# Patient Record
Sex: Female | Born: 1985 | Race: Asian | Hispanic: No | Marital: Single | Smoking: Never smoker
Health system: Southern US, Community
[De-identification: ages and names within clinical notes are randomized; demographics above are authoritative.]

## PROBLEM LIST (undated history)

## (undated) ENCOUNTER — Inpatient Hospital Stay (HOSPITAL_COMMUNITY): Payer: Self-pay

## (undated) DIAGNOSIS — Z789 Other specified health status: Secondary | ICD-10-CM

## (undated) HISTORY — PX: NO PAST SURGERIES: SHX2092

---

## 2008-06-25 ENCOUNTER — Other Ambulatory Visit: Admission: RE | Admit: 2008-06-25 | Discharge: 2008-06-25 | Payer: Self-pay | Admitting: Family Medicine

## 2011-09-02 ENCOUNTER — Ambulatory Visit: Payer: Self-pay | Admitting: Internal Medicine

## 2011-09-02 VITALS — BP 102/64 | HR 69 | Temp 98.7°F | Resp 16 | Ht 58.58 in | Wt 96.2 lb

## 2011-09-02 DIAGNOSIS — J00 Acute nasopharyngitis [common cold]: Secondary | ICD-10-CM

## 2011-09-02 DIAGNOSIS — R5383 Other fatigue: Secondary | ICD-10-CM

## 2011-09-02 DIAGNOSIS — B078 Other viral warts: Secondary | ICD-10-CM

## 2011-09-02 DIAGNOSIS — B079 Viral wart, unspecified: Secondary | ICD-10-CM

## 2011-09-02 LAB — POCT URINE PREGNANCY: Preg Test, Ur: NEGATIVE

## 2011-09-02 MED ORDER — IPRATROPIUM BROMIDE 0.06 % NA SOLN: 2.0000 | Freq: Four times a day (QID) | NASAL | Status: AC

## 2011-09-02 NOTE — Progress Notes (Signed)
  Subjective:    Patient ID: Krystal Bruce, female    DOB: [birth year], 26 y.o.   MRN: [id number]  HPI  Krystal Bruce is a healthy 26 year old [place] female here with two complaints.  The first is she has a wart on the palm of her right hand which started a month ago (she got a splinter and removed it).  She has tried to cut it out but it keeps coming back.  She has had a wart on her right hand in a different place but it went away spontaneously.  Her other complaint is that she began to have rhinitis yesterday and some fatigue.  Generally she is very healthy, with no chronic disease or problem     Review of Systems  Constitutional: Negative.   HENT: Positive for rhinorrhea, sneezing and postnasal drip.   Eyes: Negative.   Respiratory: Negative.   Cardiovascular: Negative.   Gastrointestinal: Negative.   Genitourinary: Negative.   Musculoskeletal: Negative.   Neurological: Negative.   Hematological: Negative.   Psychiatric/Behavioral: Negative.   All other systems reviewed and are negative.       Objective:   Physical Exam  Vitals reviewed. Constitutional: She is oriented to person, place, and time. She appears well-developed and well-nourished.  HENT:  Head: Normocephalic.  Right Ear: External ear normal.  Left Ear: External ear normal.  Cardiovascular: Normal rate, regular rhythm and normal heart sounds.   Neurological: She is alert and oriented to person, place, and time.  Skin:       0.5 cm brown cauliflower lesion on right palm  Psychiatric: She has a normal mood and affect. Her behavior is normal.          Assessment & Plan:  1.  Rhinitis:  Atrovent NS 0.6% 2 squirts in each nostril BID.   2.  Verruca right palm  Cryotherapy with a freeze/thaw, freeze/ thaw, was done.  Duo-film OTC if needed or return for additional freezing.  Given the name of Dr. Terri Piedra if she wishes to see a dermatologist.  RTC prn

## 2013-01-19 ENCOUNTER — Ambulatory Visit (INDEPENDENT_AMBULATORY_CARE_PROVIDER_SITE_OTHER): Payer: BC Managed Care – PPO | Admitting: Physician Assistant

## 2013-01-19 VITALS — BP 96/68 | HR 83 | Temp 98.4°F | Resp 17 | Ht 58.5 in | Wt 98.0 lb

## 2013-01-19 DIAGNOSIS — N760 Acute vaginitis: Secondary | ICD-10-CM

## 2013-01-19 DIAGNOSIS — Z0000 Encounter for general adult medical examination without abnormal findings: Secondary | ICD-10-CM

## 2013-01-19 DIAGNOSIS — Z124 Encounter for screening for malignant neoplasm of cervix: Secondary | ICD-10-CM

## 2013-01-19 DIAGNOSIS — Z01419 Encounter for gynecological examination (general) (routine) without abnormal findings: Secondary | ICD-10-CM

## 2013-01-19 DIAGNOSIS — B9689 Other specified bacterial agents as the cause of diseases classified elsewhere: Secondary | ICD-10-CM

## 2013-01-19 DIAGNOSIS — N898 Other specified noninflammatory disorders of vagina: Secondary | ICD-10-CM

## 2013-01-19 LAB — POCT CBC
HCT, POC: 42.5 % (ref 37.7–47.9)
Hemoglobin: 14 g/dL (ref 12.2–16.2)
Lymph, poc: 2.3 (ref 0.6–3.4)
MCHC: 32.9 g/dL (ref 31.8–35.4)
POC Granulocyte: 3.9 (ref 2–6.9)
WBC: 6.6 10*3/uL (ref 4.6–10.2)

## 2013-01-19 LAB — POCT WET PREP WITH KOH
KOH Prep POC: POSITIVE
Yeast Wet Prep HPF POC: NEGATIVE

## 2013-01-19 LAB — POCT URINALYSIS DIPSTICK
Bilirubin, UA: NEGATIVE
Blood, UA: NEGATIVE
Glucose, UA: NEGATIVE
Leukocytes, UA: NEGATIVE
Nitrite, UA: NEGATIVE

## 2013-01-19 MED ORDER — METRONIDAZOLE 500 MG PO TABS: 500.0000 mg | ORAL_TABLET | Freq: Two times a day (BID) | ORAL | Status: DC

## 2013-01-19 NOTE — Progress Notes (Signed)
Subjective:    Patient ID: Krystal Bruce, female    DOB: 1986/05/05, 27 y.o.   MRN: [id number]  HPI   Krystal Bruce is a very pleasant 27 yr old female here for CPE.  Thinks last CPE was here two years ago.  Complaints: would like pregnancy test; trying to conceive; otherwise feels well with no concerns LMP:  12/22/12, regular periods every month Contraception: none (trying to conceive) Pap/pelvic/breast/mammo:  Thinks last pap 2 yrs ago (last in epic 4 yrs ago, normal); SBEs - no concerns; does request STI testing today Dentist:  Not regularly - hasn't had time to go Eye doctor:  Has rx'd lenses but does not always wear, last eye doctor 1 yr ago Imm: thinks utd - recent college grad, knows she was utd while in school Diet: no special diet, 3 meals per day; doesn't drink much water Exercise: none Meds: none Family history: Mom, dad, brother healthy Does not know any further family history  No smoking or etoh use, no other substance use  Self-employed; lives with parents  Engaged  Review of Systems  Constitutional: Negative.   HENT: Negative.   Respiratory: Negative.   Cardiovascular: Negative.   Gastrointestinal: Negative.   Genitourinary: Positive for vaginal discharge.  Musculoskeletal: Negative.   Skin: Negative.   Neurological: Negative.        Objective:   Physical Exam  Vitals reviewed. Constitutional: She is oriented to person, place, and time. She appears well-developed and well-nourished. No distress.  HENT:  Head: Normocephalic and atraumatic.  Right Ear: Tympanic membrane and ear canal normal.  Left Ear: Tympanic membrane and ear canal normal.  Mouth/Throat: Uvula is midline, oropharynx is clear and moist and mucous membranes are normal.  Eyes: Conjunctivae and EOM are normal. No scleral icterus.  Neck: Normal range of motion. Neck supple. No thyromegaly present.  Cardiovascular: Normal rate, regular rhythm, normal heart sounds and intact distal pulses.  Exam reveals  no gallop and no friction rub.   No murmur heard. Pulmonary/Chest: Effort normal and breath sounds normal. She has no wheezes. She has no rales. Right breast exhibits no mass, no nipple discharge, no skin change and no tenderness. Left breast exhibits no mass, no nipple discharge, no skin change and no tenderness. Breasts are symmetrical.  Abdominal: Soft. Bowel sounds are normal. There is no tenderness.  Genitourinary: Uterus normal. There is no rash, tenderness or lesion on the right labia. There is no rash, tenderness or lesion on the left labia. Cervix exhibits friability. Cervix exhibits no motion tenderness and no discharge. Right adnexum displays tenderness. Right adnexum displays no mass and no fullness. Left adnexum displays no mass, no tenderness and no fullness. Vaginal discharge (copious, homogenous white ) found.  Musculoskeletal: Normal range of motion.  Lymphadenopathy:    She has no cervical adenopathy.  Neurological: She is alert and oriented to person, place, and time. She has normal reflexes.  Skin: Skin is warm and dry.  Psychiatric: She has a normal mood and affect. Her behavior is normal.     Results for orders placed in visit on 01/19/13  POCT CBC      Result Value Range   WBC 6.6  4.6 - 10.2 K/uL   Lymph, poc 2.3  0.6 - 3.4   POC LYMPH PERCENT 35.5  10 - 50 %L   MID (cbc) 0.4  0 - 0.9   POC MID % 6.0  0 - 12 %M   POC Granulocyte 3.9  2 -  6.9   Granulocyte percent 58.5  37 - 80 %G   RBC 4.82  4.04 - 5.48 M/uL   Hemoglobin 14.0  12.2 - 16.2 g/dL   HCT, POC 16.1  09.6 - 47.9 %   MCV 88.1  80 - 97 fL   MCH, POC 29.0  27 - 31.2 pg   MCHC 32.9  31.8 - 35.4 g/dL   RDW, POC 04.5     Platelet Count, POC 229  142 - 424 K/uL   MPV 9.1  0 - 99.8 fL  POCT URINALYSIS DIPSTICK      Result Value Range   Color, UA yellow     Clarity, UA clear     Glucose, UA neg     Bilirubin, UA neg     Ketones, UA trace     Spec Grav, UA 1.020     Blood, UA neg     pH, UA 7.0      Protein, UA neg     Urobilinogen, UA 0.2     Nitrite, UA neg     Leukocytes, UA Negative    POCT URINE PREGNANCY      Result Value Range   Preg Test, Ur Negative    POCT WET PREP WITH KOH      Result Value Range   Trichomonas, UA Negative     Clue Cells Wet Prep HPF POC 100%     Epithelial Wet Prep HPF POC 5-15     Yeast Wet Prep HPF POC neg     Bacteria Wet Prep HPF POC 3+     RBC Wet Prep HPF POC neg     WBC Wet Prep HPF POC tntc     KOH Prep POC Positive     Sperm positive         Assessment & Plan:  Routine general medical examination at a health care facility - Plan: POCT CBC, TSH, POCT urinalysis dipstick, POCT urine pregnancy, Comprehensive metabolic panel, HIV antibody, Lipid panel, RPR, Pap IG, CT/NG w/ reflex HPV when ASC-U  Encounter for routine gynecological examination - Plan: POCT urine pregnancy, Pap IG, CT/NG w/ reflex HPV when ASC-U, CANCELED: Pap IG, CT/NG w/ reflex HPV when ASC-U  Screening for cervical cancer - Plan: Pap IG, CT/NG w/ reflex HPV when ASC-U, CANCELED: Pap IG, CT/NG w/ reflex HPV when ASC-U  Vaginal discharge - Plan: POCT Wet Prep with KOH  Bacterial vaginosis - Plan: metroNIDAZOLE (FLAGYL) 500 MG tablet    Krystal Bruce is a very pleasant 27 yr old female here for CPE.  She appears to be in good health and exam is normal.  She is currently trying to conceive.  HCG is negative today.  Wet prep does show BV so will treat with metronidazole.  Pap pending.  CT/NG, HIV, RPR all pending.  CMP, lipid, TSH pending as well.  Will follow up on labs.

## 2013-01-19 NOTE — Patient Instructions (Addendum)
Begin taking the metronidazole as directed.  Be sure to finish the full course.  Keckler NOT DRINK ALCOHOL WHILE TAKING THIS MEDICATION or for 48 hours after your last dose.  Please let us know if any symptoms worsen or Boylan not improve.  I will let you know when the rest of your labs are back.   Bacterial Vaginosis Bacterial vaginosis (BV) is a vaginal infection where the normal balance of bacteria in the vagina is disrupted. The normal balance is then replaced by an overgrowth of certain bacteria. There are several different kinds of bacteria that can cause BV. BV is the most common vaginal infection in women of childbearing age. CAUSES   The cause of BV is not fully understood. BV develops when there is an increase or imbalance of harmful bacteria.  Some activities or behaviors can upset the normal balance of bacteria in the vagina and put women at increased risk including:  Having a new sex partner or multiple sex partners.  Douching.  Using an intrauterine device (IUD) for contraception.  It is not clear what role sexual activity plays in the development of BV. However, women that have never had sexual intercourse are rarely infected with BV. Women Cabriales not get BV from toilet seats, bedding, swimming pools or from touching objects around them.  SYMPTOMS   Grey vaginal discharge.  A fish-like odor with discharge, especially after sexual intercourse.  Itching or burning of the vagina and vulva.  Burning or pain with urination.  Some women have no signs or symptoms at all. DIAGNOSIS  Your caregiver must examine the vagina for signs of BV. Your caregiver will perform lab tests and look at the sample of vaginal fluid through a microscope. They will look for bacteria and abnormal cells (clue cells), a pH test higher than 4.5, and a positive amine test all associated with BV.  RISKS AND COMPLICATIONS   Pelvic inflammatory disease (PID).  Infections following gynecology surgery.  Developing  HIV.  Developing herpes virus. TREATMENT  Sometimes BV will clear up without treatment. However, all women with symptoms of BV should be treated to avoid complications, especially if gynecology surgery is planned. Female partners generally Esper not need to be treated. However, BV may spread between female sex partners so treatment is helpful in preventing a recurrence of BV.   BV may be treated with antibiotics. The antibiotics come in either pill or vaginal cream forms. Either can be used with nonpregnant or pregnant women, but the recommended dosages differ. These antibiotics are not harmful to the baby.  BV can recur after treatment. If this happens, a second round of antibiotics will often be prescribed.  Treatment is important for pregnant women. If not treated, BV can cause a premature delivery, especially for a pregnant woman who had a premature birth in the past. All pregnant women who have symptoms of BV should be checked and treated.  For chronic reoccurrence of BV, treatment with a type of prescribed gel vaginally twice a week is helpful. HOME CARE INSTRUCTIONS   Finish all medication as directed by your caregiver.  Burnstein not have sex until treatment is completed.  Tell your sexual partner that you have a vaginal infection. They should see their caregiver and be treated if they have problems, such as a mild rash or itching.  Practice safe sex. Use condoms. Only have 1 sex partner. PREVENTION  Basic prevention steps can help reduce the risk of upsetting the natural balance of bacteria in the  vagina and developing BV:  Terwilliger not have sexual intercourse (be abstinent).  Dawson not douche.  Use all of the medicine prescribed for treatment of BV, even if the signs and symptoms go away.  Tell your sex partner if you have BV. That way, they can be treated, if needed, to prevent reoccurrence. SEEK MEDICAL CARE IF:   Your symptoms are not improving after 3 days of treatment.  You have  increased discharge, pain, or fever. MAKE SURE YOU:   Understand these instructions.  Will watch your condition.  Will get help right away if you are not doing well or get worse. FOR MORE INFORMATION  Division of STD Prevention (DSTDP), Centers for Disease Control and Prevention: SolutionApps.co.za American Social Health Association (ASHA): www.ashastd.org  Document Released: 06/08/2005 Document Revised: 08/31/2011 Document Reviewed: 11/29/2008 [facility] Patient Information 2014 [place], [place].   Health Maintenance, Females A healthy lifestyle and preventative care can promote health and wellness.  Maintain regular health, dental, and eye exams.  Eat a healthy diet. Foods like vegetables, fruits, whole grains, low-fat dairy products, and lean protein foods contain the nutrients you need without too many calories. Decrease your intake of foods high in solid fats, added sugars, and salt. Get information about a proper diet from your caregiver, if necessary.  Regular physical exercise is one of the most important things you can [place] for your health. Most adults should get at least 150 minutes of moderate-intensity exercise (any activity that increases your heart rate and causes you to sweat) each week. In addition, most adults need muscle-strengthening exercises on 2 or more days a week.   Maintain a healthy weight. The body mass index (BMI) is a screening tool to identify possible weight problems. It provides an estimate of body fat based on height and weight. Your caregiver can help determine your BMI, and can help you achieve or maintain a healthy weight. For adults 20 years and older:  A BMI below 18.5 is considered underweight.  A BMI of 18.5 to 24.9 is normal.  A BMI of 25 to 29.9 is considered overweight.  A BMI of 30 and above is considered obese.  Maintain normal blood lipids and cholesterol by exercising and minimizing your intake of saturated fat. Eat a balanced diet with plenty  of fruits and vegetables. Blood tests for lipids and cholesterol should begin at age 63 and be repeated every 5 years. If your lipid or cholesterol levels are high, you are over 50, or you are a high risk for heart disease, you may need your cholesterol levels checked more frequently.Ongoing high lipid and cholesterol levels should be treated with medicines if diet and exercise are not effective.  If you smoke, find out from your caregiver how to quit. If you Urbas not use tobacco, [place] not start.  If you are pregnant, Ligman not drink alcohol. If you are breastfeeding, be very cautious about drinking alcohol. If you are not pregnant and choose to drink alcohol, Kassa not exceed 1 drink per day. One drink is considered to be 12 ounces (355 mL) of beer, 5 ounces (148 mL) of wine, or 1.5 ounces (44 mL) of liquor.  Avoid use of street drugs. Cowley not share needles with anyone. Ask for help if you need support or instructions about stopping the use of drugs.  High blood pressure causes heart disease and increases the risk of stroke. Blood pressure should be checked at least every 1 to 2 years. Ongoing high blood pressure should be  treated with medicines, if weight loss and exercise are not effective.  If you are 43 to 27 years old, ask your caregiver if you should take aspirin to prevent strokes.  Diabetes screening involves taking a blood sample to check your fasting blood sugar level. This should be done once every 3 years, after age 77, if you are within normal weight and without risk factors for diabetes. Testing should be considered at a younger age or be carried out more frequently if you are overweight and have at least 1 risk factor for diabetes.  Breast cancer screening is essential preventative care for women. You should practice "breast self-awareness." This means understanding the normal appearance and feel of your breasts and may include breast self-examination. Any changes detected, no matter how small,  should be reported to a caregiver. Women in their 44s and 30s should have a clinical breast exam (CBE) by a caregiver as part of a regular health exam every 1 to 3 years. After age 23, women should have a CBE every year. Starting at age 84, women should consider having a mammogram (breast X-ray) every year. Women who have a family history of breast cancer should talk to their caregiver about genetic screening. Women at a high risk of breast cancer should talk to their caregiver about having an MRI and a mammogram every year.  The Pap test is a screening test for cervical cancer. Women should have a Pap test starting at age 49. Between ages 68 and 48, Pap tests should be repeated every 2 years. Beginning at age 65, you should have a Pap test every 3 years as long as the past 3 Pap tests have been normal. If you had a hysterectomy for a problem that was not cancer or a condition that could lead to cancer, then you no longer need Pap tests. If you are between ages 60 and 57, and you have had normal Pap tests going back 10 years, you no longer need Pap tests. If you have had past treatment for cervical cancer or a condition that could lead to cancer, you need Pap tests and screening for cancer for at least 20 years after your treatment. If Pap tests have been discontinued, risk factors (such as a new sexual partner) need to be reassessed to determine if screening should be resumed. Some women have medical problems that increase the chance of getting cervical cancer. In these cases, your caregiver may recommend more frequent screening and Pap tests.  The human papillomavirus (HPV) test is an additional test that may be used for cervical cancer screening. The HPV test looks for the virus that can cause the cell changes on the cervix. The cells collected during the Pap test can be tested for HPV. The HPV test could be used to screen women aged 59 years and older, and should be used in women of any age who have unclear  Pap test results. After the age of 73, women should have HPV testing at the same frequency as a Pap test.  Colorectal cancer can be detected and often prevented. Most routine colorectal cancer screening begins at the age of 57 and continues through age 52. However, your caregiver may recommend screening at an earlier age if you have risk factors for colon cancer. On a yearly basis, your caregiver may provide home test kits to check for hidden blood in the stool. Use of a small camera at the end of a tube, to directly examine the colon (sigmoidoscopy or  colonoscopy), can detect the earliest forms of colorectal cancer. Talk to your caregiver about this at age 23, when routine screening begins. Direct examination of the colon should be repeated every 5 to 10 years through age 29, unless early forms of pre-cancerous polyps or small growths are found.  Hepatitis C blood testing is recommended for all people born from [birth year] through 1965 and any individual with known risks for hepatitis C.  Practice safe sex. Use condoms and avoid high-risk sexual practices to reduce the spread of sexually transmitted infections (STIs). Sexually active women aged 31 and younger should be checked for Chlamydia, which is a common sexually transmitted infection. Older women with new or multiple partners should also be tested for Chlamydia. Testing for other STIs is recommended if you are sexually active and at increased risk.  Osteoporosis is a disease in which the bones lose minerals and strength with aging. This can result in serious bone fractures. The risk of osteoporosis can be identified using a bone density scan. Women ages 52 and over and women at risk for fractures or osteoporosis should discuss screening with their caregivers. Ask your caregiver whether you should be taking a calcium supplement or vitamin D to reduce the rate of osteoporosis.  Menopause can be associated with physical symptoms and risks. Hormone replacement  therapy is available to decrease symptoms and risks. You should talk to your caregiver about whether hormone replacement therapy is right for you.  Use sunscreen with a sun protection factor (SPF) of 30 or greater. Apply sunscreen liberally and repeatedly throughout the day. You should seek shade when your shadow is shorter than you. Protect yourself by wearing long sleeves, pants, a wide-brimmed hat, and sunglasses year round, whenever you are outdoors.  Notify your caregiver of new moles or changes in moles, especially if there is a change in shape or color. Also notify your caregiver if a mole is larger than the size of a pencil eraser.  Stay current with your immunizations. Document Released: 12/22/2010 Document Revised: 08/31/2011 Document Reviewed: 12/22/2010 [facility] Patient Information 2014 [place], [place].

## 2013-01-20 LAB — COMPREHENSIVE METABOLIC PANEL
ALT: <8 U/L (ref 0–35)
AST: 13 U/L (ref 0–37)
Albumin: 4.6 g/dL (ref 3.5–5.2)
Alkaline Phosphatase: 34 U/L — ABNORMAL LOW (ref 39–117)
Glucose, Bld: 81 mg/dL (ref 70–99)
Potassium: 4.4 mEq/L (ref 3.5–5.3)
Sodium: 138 mEq/L (ref 135–145)
Total Protein: 7.6 g/dL (ref 6.0–8.3)

## 2013-01-20 LAB — TSH: TSH: 1.356 u[IU]/mL (ref 0.350–4.500)

## 2013-01-20 LAB — PAP IG, CT-NG, RFX HPV ASCU: GC Probe Amp: NEGATIVE

## 2013-01-20 LAB — RPR

## 2013-01-20 LAB — LIPID PANEL
LDL Cholesterol: 99 mg/dL (ref 0–99)
VLDL: 9 mg/dL (ref 0–40)

## 2013-02-25 ENCOUNTER — Encounter: Payer: Self-pay | Admitting: Physician Assistant

## 2013-02-25 ENCOUNTER — Ambulatory Visit (INDEPENDENT_AMBULATORY_CARE_PROVIDER_SITE_OTHER): Payer: BC Managed Care – PPO | Admitting: Physician Assistant

## 2013-02-25 VITALS — BP 100/58 | HR 78 | Temp 97.2°F | Resp 16 | Ht <= 58 in | Wt 97.2 lb

## 2013-02-25 DIAGNOSIS — N926 Irregular menstruation, unspecified: Secondary | ICD-10-CM

## 2013-02-25 DIAGNOSIS — N925 Other specified irregular menstruation: Secondary | ICD-10-CM

## 2013-02-25 DIAGNOSIS — N949 Unspecified condition associated with female genital organs and menstrual cycle: Secondary | ICD-10-CM

## 2013-02-25 NOTE — Patient Instructions (Addendum)
CONGRATULATIONS!!!  You need to establish with an OB/GYN. Please start an over-the-counter prenatal vitamin.   Based on the date of your last menstrual period, you are 4 weeks and 1 day pregnant, and you are due on 11/04/2013.

## 2013-02-26 NOTE — Progress Notes (Signed)
  Subjective:    Patient ID: BEKKA QIAN, female    DOB: April 21, 1986, 27 y.o.   MRN: [id number]  HPI This 27 y.o. female presents for evaluation of late menses.  She is actually not late based on her LMP, but because last month her period was a few days later than usual, she believes she is late this month, too.  She and her husband are actively trying to become pregnant.  She feels well, without nausea, fatigue or breast tenderness.  Review of Systems As above.    Objective:   Physical Exam BP 100/58  Pulse 78  Temp(Src) 97.2 F (36.2 C) (Oral)  Resp 16  Ht 4\' 10"  (1.473 m)  Wt 97 lb 3.2 oz (44.09 kg)  BMI 20.32 kg/m2  SpO2 100%  LMP 01/27/2013 WDWN, but very thin, Asian female who is A&O x 3.  Her husband is present. Normal respiratory effort. Normal mood and affect, normal behavior.  Results for orders placed in visit on 02/25/13  POCT URINE PREGNANCY      Result Value Range   Preg Test, Ur Positive          Assessment & Plan:  Menstrual period late - Plan: POCT urine pregnancy  Schedule with OB of choice.  Begin Prenatal Vitamins QD.  Anticipatory guidance provided.  Fernande Bras, PA-C Physician Assistant-Certified [facility]

## 2013-03-06 ENCOUNTER — Ambulatory Visit (INDEPENDENT_AMBULATORY_CARE_PROVIDER_SITE_OTHER): Payer: BC Managed Care – PPO | Admitting: Physician Assistant

## 2013-03-06 VITALS — BP 82/54 | HR 98 | Temp 99.3°F | Resp 16 | Ht 58.5 in | Wt 97.0 lb

## 2013-03-06 DIAGNOSIS — R42 Dizziness and giddiness: Secondary | ICD-10-CM

## 2013-03-06 DIAGNOSIS — Z331 Pregnant state, incidental: Secondary | ICD-10-CM

## 2013-03-06 DIAGNOSIS — Z3490 Encounter for supervision of normal pregnancy, unspecified, unspecified trimester: Secondary | ICD-10-CM

## 2013-03-06 NOTE — Patient Instructions (Signed)
I like the book Your Pregnancy Week by Week by Lindwood Qua  Drink 64 ounces of water a day. Make healthy eating choices. Get exercise every day-even just walking for 15-30 minutes can help you feel better. Consider wearing a light support athletic bra at night if your breasts are tender.

## 2013-03-06 NOTE — Progress Notes (Signed)
  Subjective:    Patient ID: Krystal Bruce, female    DOB: 1985-10-20, 27 y.o.   MRN: [id number]  HPI  This 27 y.o. female presents for evaluation of concerns about her pregnancy.    LMP 01/27/2013.  She presented here 02/25/2013 requesting a pregnancy test, which was positive. She and her husband had just returned from [place], a pre-wedding trip, were actively trying to become pregnant. They were given information about pregnancy and scheduling with OB/GYN, and was advised to start a prenatal vitamin. She reports that she has been taking the prenatal vitamin, but lost the information I gave her and is feeling nervous.  "I don't even know how to find a doctor!" "I just want a check to make sure the baby is OK."  Their wedding was yesterday.  She was under a lot of stress, as she planned the trip, wedding and reception for 400+ people by herself.   Since finding out she is pregnant, she has developed some breast tenderness, especially at night.  Daytime sleepiness, nighttime hunger. No nausea. Mild brief pelvic discomfort, no vaginal bleeding.  Yesterday was very emotional, and she found herself more irritable than usual, getting angry when she normally wouldn't. She has felt some heaviness '"inside," (points to the upper abdomen). This morning she was a little dizzy.  Review of Systems As above.    Objective:   Physical Exam BP 82/54  Pulse 98  Temp(Src) 99.3 F (37.4 C) (Oral)  Resp 16  Ht 4' 10.5" (1.486 m)  Wt 97 lb (43.999 kg)  BMI 19.93 kg/m2  SpO2 100%  LMP 01/27/2013 WDWNAF, A&O x 3. A little anxious, but otherwise normal appearing and behavior.  5 3/[redacted] weeks gestation; LMP 01/27/2013, EDD 11/04/2013     Assessment & Plan:  Pregnancy - Plan: Ambulatory referral to Obstetrics / Gynecology; 5 3/[redacted] weeks gestation; LMP 01/27/2013, EDD 11/04/2013  Referral made to help reduce her anxiety, and advised her that they may not schedule her until closer to 10-12 weeks. Reassured that her  feelings are normal.  Discussed that I cannot hear the heartbeat this early, and that an OB US can document intrauterine pregnancy, but not necessarily that "everything is ok." Anticipatory guidance provided.    Continue prenatal vitamin. 64 ounces of water daily. Healthy eating choices, walking for exercise. Get the book, Your Pregnancy Week by Week  Fernande Bras, PA-C Physician Assistant-Certified [facility]

## 2013-03-10 ENCOUNTER — Inpatient Hospital Stay (HOSPITAL_COMMUNITY)
Admission: AD | Admit: 2013-03-10 | Discharge: 2013-03-10 | Disposition: A | Discharge status: Home / Self Care | Payer: BC Managed Care – PPO | Source: Ambulatory Visit | Attending: Obstetrics & Gynecology | Admitting: Obstetrics & Gynecology

## 2013-03-10 ENCOUNTER — Inpatient Hospital Stay (HOSPITAL_COMMUNITY): Payer: BC Managed Care – PPO

## 2013-03-10 ENCOUNTER — Encounter (HOSPITAL_COMMUNITY): Payer: Self-pay | Admitting: *Deleted

## 2013-03-10 DIAGNOSIS — Z363 Encounter for antenatal screening for malformations: Secondary | ICD-10-CM

## 2013-03-10 DIAGNOSIS — Z3490 Encounter for supervision of normal pregnancy, unspecified, unspecified trimester: Secondary | ICD-10-CM

## 2013-03-10 DIAGNOSIS — M545 Low back pain, unspecified: Secondary | ICD-10-CM | POA: Insufficient documentation

## 2013-03-10 DIAGNOSIS — O99891 Other specified diseases and conditions complicating pregnancy: Secondary | ICD-10-CM | POA: Insufficient documentation

## 2013-03-10 DIAGNOSIS — R109 Unspecified abdominal pain: Secondary | ICD-10-CM | POA: Insufficient documentation

## 2013-03-10 DIAGNOSIS — N949 Unspecified condition associated with female genital organs and menstrual cycle: Secondary | ICD-10-CM | POA: Insufficient documentation

## 2013-03-10 DIAGNOSIS — Z1389 Encounter for screening for other disorder: Secondary | ICD-10-CM

## 2013-03-10 LAB — WET PREP, GENITAL
Clue Cells Wet Prep HPF POC: NONE SEEN
Trich, Wet Prep: NONE SEEN

## 2013-03-10 LAB — CBC
Hemoglobin: 13.2 g/dL (ref 12.0–15.0)
MCH: 28.9 pg (ref 26.0–34.0)
MCV: 82.3 fL (ref 78.0–100.0)
Platelets: 207 10*3/uL (ref 150–400)
RBC: 4.57 MIL/uL (ref 3.87–5.11)

## 2013-03-10 NOTE — MAU Provider Note (Signed)
History     CSN: [id number]  Arrival date and time: 03/10/13 1135   First Provider Initiated Contact with Patient 03/10/13 1421      No chief complaint on file.  HPI This is a 27 y.o. female at [redacted]w[redacted]d who presents with c/o being "stressed out" during wedding and wants to "see if the baby is all right".  Does c/o low back pain and pelvic pressure for "a while".  Denies bleeding or other symptoms.   RN Note: Was in [place] in Aug, had period 08/08-10, this was late. Beginning of Sept Found out was preg. Been very stressed- wants to make sure everything is ok. Feels tired. First appt is 10/07  Revision History...     OB History   Grav Para Term Preterm Abortions TAB SAB Ect Mult Living   1               History reviewed. No pertinent past medical history.  History reviewed. No pertinent past surgical history.  History reviewed. No pertinent family history.  History  Substance Use Topics  . Smoking status: Never Smoker   . Smokeless tobacco: Not on file  . Alcohol Use: No    Allergies: No Known Allergies  Prescriptions prior to admission  Medication Sig Dispense Refill  . prenatal vitamin w/FE, FA (NATACHEW) 29-1 MG CHEW chewable tablet Chew 2 tablets by mouth daily at 12 noon.         Review of Systems  Constitutional: Negative for fever, chills and malaise/fatigue.  Gastrointestinal: Positive for abdominal pain. Negative for nausea, vomiting, diarrhea and constipation.  Genitourinary: Negative for dysuria.  Musculoskeletal: Positive for back pain. Negative for myalgias.  Neurological: Negative for dizziness, weakness and headaches.   Physical Exam   Blood pressure 100/57, pulse 88, temperature 98.3 F (36.8 C), temperature source Oral, resp. rate 16, height 4\' 9"  (1.448 m), weight 44.271 kg (97 lb 9.6 oz), last menstrual period 01/27/2013.  Physical Exam  Constitutional: She is oriented to person, place, and time. She appears well-developed and well-nourished. No  distress.  Cardiovascular: Normal rate.   Respiratory: Effort normal.  GI: Soft. She exhibits no distension and no mass. There is tenderness (slight suprapubic tenderness). There is no rebound and no guarding.  Genitourinary: Vagina normal and uterus normal. No vaginal discharge found.  Uterus slightly tender, 5-6 wk size Adnexae nontender  Musculoskeletal: Normal range of motion.  Neurological: She is alert and oriented to person, place, and time.  Skin: Skin is warm and dry.  Psychiatric: She has a normal mood and affect.    MAU Course  Procedures  MDM Results for orders placed during the hospital encounter of 03/10/13 (from the past 72 hour(s))  WET PREP, GENITAL     Status: Abnormal   Collection Time    03/10/13  2:43 PM      Result Value Range   Yeast Wet Prep HPF POC RARE (*) NONE SEEN   Trich, Wet Prep NONE SEEN  NONE SEEN   Clue Cells Wet Prep HPF POC NONE SEEN  NONE SEEN   WBC, Wet Prep HPF POC FEW (*) NONE SEEN   Comment: BACTERIA- TOO NUMEROUS TO COUNT  HCG, QUANTITATIVE, PREGNANCY     Status: Abnormal   Collection Time    03/10/13  2:55 PM      Result Value Range   hCG, Beta Chain, Quant, S [id number] (*) <5 mIU/mL   Comment:  GEST. AGE      CONC.  (mIU/mL)       <=1 WEEK        5 - 50         2 WEEKS       50 - 500         3 WEEKS       100 - 10,000         4 WEEKS     1,000 - 30,000         5 WEEKS     3,500 - 115,000       6-8 WEEKS     12,000 - 270,000        12 WEEKS     15,000 - 220,000                FEMALE AND NON-PREGNANT FEMALE:         LESS THAN 5 mIU/mL  ABO/RH     Status: None   Collection Time    03/10/13  2:55 PM      Result Value Range   ABO/RH(D) B POS    CBC     Status: None   Collection Time    03/10/13  2:55 PM      Result Value Range   WBC 7.9  4.0 - 10.5 K/uL   RBC 4.57  3.87 - 5.11 MIL/uL   Hemoglobin 13.2  12.0 - 15.0 g/dL   HCT 16.1  09.6 - 04.5 %   MCV 82.3  78.0 - 100.0 fL   MCH 28.9  26.0 - 34.0 pg   MCHC 35.1   30.0 - 36.0 g/dL   RDW 40.9  81.1 - 91.4 %   Platelets 207  150 - 400 K/uL  US Ob Transvaginal  03/10/2013   *RADIOLOGY REPORT*  Clinical Data: Pelvic pain.  Positive pregnancy test.  OBSTETRIC <14 WK [place] AND TRANSVAGINAL OB US  Technique:  Both transabdominal and transvaginal ultrasound examinations were performed for complete evaluation of the gestation as well as the maternal uterus, adnexal regions, and pelvic cul-de-sac.  Transvaginal technique was performed to assess early pregnancy.  Comparison:  None.  Intrauterine gestational sac:  Visualized/normal in shape. Yolk sac: Visualized Embryo: Visualized Cardiac Activity: Visualized Heart Rate: Unable to accurately measured due to small embryo size but visualized at real time exam.  CRL: 2  mm  5 w  6 d          [place] EDC: 11/04/13  Maternal uterus/adnexae: Left ovary not visualized.  Right ovary normal.  Small free fluid noted.    IMPRESSION: Intrauterine gestational sac, yolk sac, fetal pole, and cardiac activity visualized.  No acute abnormality.     Original Report Authenticated By: Christiana Pellant, M.D.    Assessment and Plan  A:  SIUP at 5.6 weeks      Abdominal pain probably GI or ligamentous   P:  Discussed findings.       Has PN vitamins      Encouraged to seek [facility], Had an appt in our clinic, but does not want to wait that long so will look for other practice as she is very anxious about this pregnancy  [facility] 03/10/2013, 3:29 PM

## 2013-03-10 NOTE — MAU Note (Addendum)
Was in [place] in Aug, had period 08/08-10, this was late.  Beginning of Sept  Found out was preg.   Been very stressed- wants to make sure everything is ok.  Feels tired.  First appt is 10/07

## 2013-03-19 NOTE — MAU Provider Note (Signed)
Attestation of Attending Supervision of Advanced Practitioner: Evaluation and management procedures were performed by the PA/NP/CNM/OB Fellow under my supervision/collaboration. Chart reviewed and agree with management and plan.  Tilda Burrow 03/19/2013 6:46 PM

## 2013-04-05 ENCOUNTER — Encounter (HOSPITAL_COMMUNITY): Payer: Self-pay

## 2013-04-05 ENCOUNTER — Inpatient Hospital Stay (HOSPITAL_COMMUNITY)
Admission: AD | Admit: 2013-04-05 | Discharge: 2013-04-05 | Disposition: A | Discharge status: Home / Self Care | Payer: BC Managed Care – PPO | Source: Ambulatory Visit | Attending: Obstetrics & Gynecology | Admitting: Obstetrics & Gynecology

## 2013-04-05 DIAGNOSIS — O99891 Other specified diseases and conditions complicating pregnancy: Secondary | ICD-10-CM | POA: Insufficient documentation

## 2013-04-05 DIAGNOSIS — O26899 Other specified pregnancy related conditions, unspecified trimester: Secondary | ICD-10-CM

## 2013-04-05 DIAGNOSIS — R109 Unspecified abdominal pain: Secondary | ICD-10-CM | POA: Insufficient documentation

## 2013-04-05 LAB — URINALYSIS, ROUTINE W REFLEX MICROSCOPIC
Leukocytes, UA: NEGATIVE
Nitrite: NEGATIVE
Protein, ur: NEGATIVE mg/dL
Urobilinogen, UA: 0.2 mg/dL (ref 0.0–1.0)

## 2013-04-05 LAB — URINE MICROSCOPIC-ADD ON

## 2013-04-05 NOTE — MAU Note (Signed)
Patient states she started having abdominal pain about 20 minutes ago around the right side of the umbilicus and a few inches above and below. States she has nausea all the time with vomiting sometime. Denies bleeding and states she has a vaginal discharge all the time.

## 2013-04-05 NOTE — MAU Provider Note (Signed)
Chief Complaint: Abdominal Pain   First Provider Initiated Contact with Patient 04/05/13 1900     SUBJECTIVE HPI: Krystal Bruce is a 27 y.o. G1P0 at [redacted]w[redacted]d by LMP who presents to maternity admissions reporting sharp/burning abdominal pain with onset 20 minutes before arrival in MAU tonight that started while she was urinating.   Pt reports that the pain is near her umbilicus and was worse when it started and sharp but is now a constant burning pain.  She has had regular bowel movements without difficulty, last one this morning.  She is worried that something is wrong with the pregnancy. She expresses concern when Dr on call was mentioned, because she did not recognize the name.  She expresses concern that we might talk to the wrong Dr about her.  [facility] confirmed with pt.  She denies vaginal bleeding, vaginal itching/burning, urinary symptoms, h/a, dizziness, n/v, or fever/chills.     History reviewed. No pertinent past medical history. History reviewed. No pertinent past surgical history. History   Social History  . Marital Status: Single    Spouse Name: N/A    Number of Children: N/A  . Years of Education: N/A   Occupational History  . Not on file.   Social History Main Topics  . Smoking status: Never Smoker   . Smokeless tobacco: Not on file  . Alcohol Use: No  . Drug Use: No  . Sexual Activity: Yes    Birth Control/ Protection: None   Other Topics Concern  . Not on file   Social History Narrative  . No narrative on file   No current facility-administered medications on file prior to encounter.   No current outpatient prescriptions on file prior to encounter.   No Known Allergies  ROS: Pertinent items in HPI  OBJECTIVE Blood pressure 95/60, pulse 75, temperature 98.5 F (36.9 C), temperature source Oral, resp. rate 16, height 4\' 10"  (1.473 m), weight 100 lb 6.4 oz (45.541 kg), last menstrual period 01/27/2013, SpO2 100.00%. GENERAL: Well-developed,  well-nourished female in no acute distress.  HEENT: Normocephalic HEART: normal rate RESP: normal effort ABDOMEN: Soft, non-tender, no rebound tenderness, no guarding EXTREMITIES: Nontender, no edema NEURO: Alert and oriented SPECULUM EXAM: Deferred  LAB RESULTS Results for orders placed during the hospital encounter of 04/05/13 (from the past 24 hour(s))  URINALYSIS, ROUTINE W REFLEX MICROSCOPIC     Status: Abnormal   Collection Time    04/05/13  5:30 PM      Result Value Range   Color, Urine YELLOW  YELLOW   APPearance CLEAR  CLEAR   Specific Gravity, Urine 1.020  1.005 - 1.030   pH 6.0  5.0 - 8.0   Glucose, UA NEGATIVE  NEGATIVE mg/dL   Hgb urine dipstick TRACE (*) NEGATIVE   Bilirubin Urine NEGATIVE  NEGATIVE   Ketones, ur NEGATIVE  NEGATIVE mg/dL   Protein, ur NEGATIVE  NEGATIVE mg/dL   Urobilinogen, UA 0.2  0.0 - 1.0 mg/dL   Nitrite NEGATIVE  NEGATIVE   Leukocytes, UA NEGATIVE  NEGATIVE  URINE MICROSCOPIC-ADD ON     Status: Abnormal   Collection Time    04/05/13  5:30 PM      Result Value Range   Squamous Epithelial / LPF FEW (*) RARE   RBC / HPF 0-2  <3 RBC/hpf     ASSESSMENT 1. Abdominal pain in pregnancy     PLAN Consult Dr Arlyce Dice Discharge home F/U in office  Drink plenty of water Return  to MAU if pain persists or worsens    Medication List    ASK your doctor about these medications       prenatal multivitamin Tabs tablet  Take 1 tablet by mouth daily at 12 noon.         Sharen Counter Certified Nurse-Midwife 04/05/2013  7:00 PM

## 2013-06-22 NOTE — L&D Delivery Note (Signed)
Operative Delivery Note  Patient pushed for 2 hours and at 10:32 AM a viable female was delivered via Vaginal, Vacuum Investment banker, operational(Extractor).  Presentation: vertex; Position: Left,, Occiput,, Anterior; Station: +2.  Verbal consent: obtained from patient.  Risks and benefits discussed in detail.  Risks include, but are not limited to the risks of anesthesia, bleeding, infection, damage to maternal tissues, fetal cephalhematoma.  There is also the risk of inability to effect vaginal delivery of the head, or shoulder dystocia that cannot be resolved by established maneuvers, leading to the need for emergency cesarean section.  The decision was made to perform a vacuum assisted vaginal delivery due to Non-reassuring fetal heart tracing. With maternal effort Vertex was +2 station. Position confirmed to be LOA, foley catheter had just been discontinued, therefore bladder was empty. Epidural  Was found to be adequate. Midline episiotomy was performed and after 4 pulls with maternal effort, the fetal head was delivered three nuchal cords were reduced followed easily by the shoulder and body.  The cord was double clamped and cut and the infant immediately handed to the waiting NICU team. Cord gases obtained. The placenta was spontaneously delivered intact with trailing membranes.  The midline episiotomy was repaired in usual fashion using 2-0 chromic suture.  Hemostasis was achieved.    APGAR: 1, 3, 5; weight 6 lb 4.9 oz (2860 g).   Placenta status: Intact, Spontaneous.   Cord: 3 vessels with the following complications: None.  Cord pH: 7.2   Est. Blood Loss (mL): 400  Mom to postpartum.  Baby to Nursery.  Ronak Duquette 11/01/2013, 1:35 PM

## 2013-09-09 ENCOUNTER — Encounter (HOSPITAL_COMMUNITY): Payer: Self-pay | Admitting: *Deleted

## 2013-09-09 ENCOUNTER — Inpatient Hospital Stay (HOSPITAL_COMMUNITY)
Admission: AD | Admit: 2013-09-09 | Discharge: 2013-09-09 | Disposition: A | Discharge status: Home / Self Care | Payer: Medicaid Other | Source: Ambulatory Visit | Attending: Obstetrics and Gynecology | Admitting: Obstetrics and Gynecology

## 2013-09-09 DIAGNOSIS — O2390 Unspecified genitourinary tract infection in pregnancy, unspecified trimester: Secondary | ICD-10-CM | POA: Insufficient documentation

## 2013-09-09 DIAGNOSIS — B373 Candidiasis of vulva and vagina: Secondary | ICD-10-CM

## 2013-09-09 DIAGNOSIS — O212 Late vomiting of pregnancy: Secondary | ICD-10-CM | POA: Insufficient documentation

## 2013-09-09 DIAGNOSIS — B3731 Acute candidiasis of vulva and vagina: Secondary | ICD-10-CM | POA: Insufficient documentation

## 2013-09-09 DIAGNOSIS — O2340 Unspecified infection of urinary tract in pregnancy, unspecified trimester: Secondary | ICD-10-CM

## 2013-09-09 DIAGNOSIS — R109 Unspecified abdominal pain: Secondary | ICD-10-CM | POA: Insufficient documentation

## 2013-09-09 DIAGNOSIS — N390 Urinary tract infection, site not specified: Secondary | ICD-10-CM

## 2013-09-09 DIAGNOSIS — R112 Nausea with vomiting, unspecified: Secondary | ICD-10-CM

## 2013-09-09 HISTORY — DX: Other specified health status: Z78.9

## 2013-09-09 LAB — URINALYSIS, ROUTINE W REFLEX MICROSCOPIC
Bilirubin Urine: NEGATIVE
GLUCOSE, UA: NEGATIVE mg/dL
KETONES UR: NEGATIVE mg/dL
NITRITE: NEGATIVE
PH: 7.5 (ref 5.0–8.0)
Protein, ur: NEGATIVE mg/dL
SPECIFIC GRAVITY, URINE: 1.01 (ref 1.005–1.030)
Urobilinogen, UA: 0.2 mg/dL (ref 0.0–1.0)

## 2013-09-09 LAB — URINE MICROSCOPIC-ADD ON

## 2013-09-09 MED ORDER — ONDANSETRON 8 MG PO TBDP: 8.0000 mg | ORAL_TABLET | Freq: Three times a day (TID) | ORAL | Status: DC | PRN

## 2013-09-09 MED ORDER — TERCONAZOLE 0.4 % VA CREA: 1.0000 | TOPICAL_CREAM | Freq: Every day | VAGINAL | Status: DC

## 2013-09-09 MED ORDER — NITROFURANTOIN MONOHYD MACRO 100 MG PO CAPS: 100.0000 mg | ORAL_CAPSULE | Freq: Two times a day (BID) | ORAL | Status: DC

## 2013-09-09 MED ORDER — ONDANSETRON 8 MG PO TBDP
8.0000 mg | ORAL_TABLET | Freq: Once | ORAL | Status: AC
  Administered 2013-09-09: 8 mg via ORAL
  Filled 2013-09-09: qty 1

## 2013-09-09 NOTE — MAU Note (Signed)
Pt here for mid/lower abd pain, denies bleeding or lof, has had nausea, no vomiting.

## 2013-09-09 NOTE — Discharge Instructions (Signed)
Keep taking in fluids as you have been doing. Get your prescriptions today and begin taking the Macrobid as directed. Keep your appointments in the office. Call your doctor if your condition worsens.

## 2013-09-09 NOTE — MAU Provider Note (Signed)
History     CSN: [id number]  Arrival date and time: 09/09/13 [id number]   First Provider Initiated Contact with Patient 09/09/13 1106      Chief Complaint  Patient presents with  . Abdominal Pain  . Back Pain   HPI Krystal Bruce 28 y.o. [id number]  Comes to MAU with lower abdominal cramping.  Having some nausea.  Vomited x one in MAU.  Has called her doctor and thought she should come in as the cramping was getting worse.  OB History   Grav Para Term Preterm Abortions TAB SAB Ect Mult Living   1               Past Medical History  Diagnosis Date  . Medical history non-contributory     Past Surgical History  Procedure Laterality Date  . No past surgeries      History reviewed. No pertinent family history.  History  Substance Use Topics  . Smoking status: Never Smoker   . Smokeless tobacco: Never Used  . Alcohol Use: No    Allergies: No Known Allergies  Prescriptions prior to admission  Medication Sig Dispense Refill  . Prenatal Vit-Fe Fumarate-FA (PRENATAL MULTIVITAMIN) TABS tablet Take 1 tablet by mouth daily at 12 noon.        Review of Systems  Constitutional: Negative for fever.  Gastrointestinal: Positive for nausea, vomiting and abdominal pain. Negative for diarrhea and constipation.  Genitourinary:       No vaginal discharge. No vaginal bleeding. No dysuria. Vaginal itching.   Physical Exam   Blood pressure 107/61, pulse 110, temperature 97.9 F (36.6 C), temperature source Oral, resp. rate 18, height 4\' 11"  (1.499 m), weight 132 lb (59.875 kg), last menstrual period 01/27/2013.  Physical Exam  Nursing note and vitals reviewed. Constitutional: She is oriented to person, place, and time. She appears well-developed and well-nourished. No distress.  HENT:  Head: Normocephalic.  Eyes: EOM are normal.  Neck: Neck supple.  GI: Soft. There is no tenderness. There is no rebound and no guarding.  Genitourinary:  Speculum exam - no leaking, no bleeding, thick  yellow discharge, curdy, consistent with yeast infection, cervix appears closed. Vulva - edematous Bimanual exam - cervix closed and thick, soft  Musculoskeletal: Normal range of motion.  Having low midline back pain.  No CVA tenderness.  Neurological: She is alert and oriented to person, place, and time.  Skin: Skin is warm and dry.  Psychiatric: She has a normal mood and affect.    MAU Course  Procedures Results for orders placed during the hospital encounter of 09/09/13 (from the past 24 hour(s))  URINALYSIS, ROUTINE W REFLEX MICROSCOPIC     Status: Abnormal   Collection Time    09/09/13  9:53 AM      Result Value Ref Range   Color, Urine YELLOW  YELLOW   APPearance CLEAR  CLEAR   Specific Gravity, Urine 1.010  1.005 - 1.030   pH 7.5  5.0 - 8.0   Glucose, UA NEGATIVE  NEGATIVE mg/dL   Hgb urine dipstick TRACE (*) NEGATIVE   Bilirubin Urine NEGATIVE  NEGATIVE   Ketones, ur NEGATIVE  NEGATIVE mg/dL   Protein, ur NEGATIVE  NEGATIVE mg/dL   Urobilinogen, UA 0.2  0.0 - 1.0 mg/dL   Nitrite NEGATIVE  NEGATIVE   Leukocytes, UA SMALL (*) NEGATIVE  URINE MICROSCOPIC-ADD ON     Status: Abnormal   Collection Time    09/09/13  9:53 AM  Result Value Ref Range   Squamous Epithelial / LPF FEW (*) RARE   WBC, UA 3-6  <3 WBC/hpf   RBC / HPF 3-6  <3 RBC/hpf   Bacteria, UA FEW (*) RARE    MDM Client felt better after vomiting. Consult with Dr. Tenny Craw by phone -discussed plan of care.  Assessment and Plan  Nausea and vomiting UTI Yeast infection  Plan Urine culture pending Rx Macrobid, Terazol, zofran Keep taking in fluids as you have been doing. Get your prescriptions today and begin taking the Macrobid as directed. Keep your appointments in the office. Call your doctor if your condition worsens.   Krystal Bruce 09/09/2013, 12:11 PM

## 2013-09-09 NOTE — MAU Note (Signed)
Patient presents with complaint of abdominal and lower back pain since last night.

## 2013-09-10 LAB — URINE CULTURE: Colony Count: 1000

## 2013-10-05 LAB — OB RESULTS CONSOLE GBS: STREP GROUP B AG: NEGATIVE

## 2013-10-24 ENCOUNTER — Inpatient Hospital Stay (HOSPITAL_COMMUNITY)
Admission: AD | Admit: 2013-10-24 | Discharge: 2013-10-24 | Disposition: A | Discharge status: Home / Self Care | Payer: Medicaid Other | Source: Ambulatory Visit | Attending: Obstetrics and Gynecology | Admitting: Obstetrics and Gynecology

## 2013-10-24 ENCOUNTER — Encounter (HOSPITAL_COMMUNITY): Payer: Self-pay | Admitting: General Practice

## 2013-10-24 DIAGNOSIS — O9989 Other specified diseases and conditions complicating pregnancy, childbirth and the puerperium: Principal | ICD-10-CM

## 2013-10-24 DIAGNOSIS — R109 Unspecified abdominal pain: Secondary | ICD-10-CM | POA: Insufficient documentation

## 2013-10-24 DIAGNOSIS — O99891 Other specified diseases and conditions complicating pregnancy: Secondary | ICD-10-CM | POA: Insufficient documentation

## 2013-10-24 NOTE — MAU Note (Signed)
Pt G1 at 38.4wks, having abdominal tightening today.  Pt denies bleeding or leaking.

## 2013-10-24 NOTE — Discharge Instructions (Signed)
Braxton Hicks Contractions °Pregnancy is commonly associated with contractions of the uterus throughout the pregnancy. Towards the end of pregnancy (32 to 34 weeks), these contractions (Braxton Hicks) can develop more often and may become more forceful. This is not true labor because these contractions Worden not result in opening (dilatation) and thinning of the cervix. They are sometimes difficult to tell apart from true labor because these contractions can be forceful and people have different pain tolerances. You should not feel embarrassed if you go to the hospital with false labor. Sometimes, the only way to tell if you are in true labor is for your caregiver to follow the changes in the cervix. °How to tell the difference between true and false labor: °· False labor. °· The contractions of false labor are usually shorter, irregular and not as hard as those of true labor. °· They are often felt in the front of the lower abdomen and in the groin. °· They may leave with walking around or changing positions while lying down. °· They get weaker and are shorter lasting as time goes on. °· These contractions are usually irregular. °· They Rollins not usually become progressively stronger, regular and closer together as with true labor. °· True labor. °· Contractions in true labor last 30 to 70 seconds, become very regular, usually become more intense, and increase in frequency. °· They Lingo not go away with walking. °· The discomfort is usually felt in the top of the uterus and spreads to the lower abdomen and low back. °· True labor can be determined by your caregiver with an exam. This will show that the cervix is dilating and getting thinner. °If there are no prenatal problems or other health problems associated with the pregnancy, it is completely safe to be sent home with false labor and await the onset of true labor. °HOME CARE INSTRUCTIONS  °· Keep up with your usual exercises and instructions. °· Take medications as  directed. °· Keep your regular prenatal appointment. °· Eat and drink lightly if you think you are going into labor. °· If BH contractions are making you uncomfortable: °· Change your activity position from lying down or resting to walking/walking to resting. °· Sit and rest in a tub of warm water. °· Drink 2 to 3 glasses of water. Dehydration may cause B-H contractions. °· Fencl slow and deep breathing several times an hour. °SEEK IMMEDIATE MEDICAL CARE IF:  °· Your contractions continue to become stronger, more regular, and closer together. °· You have a gushing, burst or leaking of fluid from the vagina. °· An oral temperature above 102° F (38.9° C) develops. °· You have passage of blood-tinged mucus. °· You develop vaginal bleeding. °· You develop continuous belly (abdominal) pain. °· You have low back pain that you never had before. °· You feel the baby's head pushing down causing pelvic pressure. °· The baby is not moving as much as it used to. °Document Released: 06/08/2005 Document Revised: 08/31/2011 Document Reviewed: 03/20/2013 °Exit[employer]® Patient Information ©2014 [employer]. ° °

## 2013-10-31 ENCOUNTER — Inpatient Hospital Stay (HOSPITAL_COMMUNITY)
Admission: AD | Admit: 2013-10-31 | Discharge: 2013-11-03 | DRG: 775 | Disposition: A | Discharge status: Home / Self Care | Payer: Medicaid Other | Source: Ambulatory Visit | Attending: Obstetrics & Gynecology | Admitting: Obstetrics & Gynecology

## 2013-10-31 ENCOUNTER — Encounter (HOSPITAL_COMMUNITY): Payer: Medicaid Other | Admitting: Anesthesiology

## 2013-10-31 ENCOUNTER — Inpatient Hospital Stay (HOSPITAL_COMMUNITY): Payer: Medicaid Other | Admitting: Anesthesiology

## 2013-10-31 ENCOUNTER — Encounter (HOSPITAL_COMMUNITY): Payer: Self-pay | Admitting: *Deleted

## 2013-10-31 DIAGNOSIS — O878 Other venous complications in the puerperium: Secondary | ICD-10-CM | POA: Diagnosis present

## 2013-10-31 DIAGNOSIS — Z8759 Personal history of other complications of pregnancy, childbirth and the puerperium: Secondary | ICD-10-CM

## 2013-10-31 DIAGNOSIS — K649 Unspecified hemorrhoids: Secondary | ICD-10-CM | POA: Diagnosis present

## 2013-10-31 DIAGNOSIS — O411090 Infection of amniotic sac and membranes, unspecified, unspecified trimester, not applicable or unspecified: Secondary | ICD-10-CM | POA: Diagnosis present

## 2013-10-31 LAB — OB RESULTS CONSOLE GC/CHLAMYDIA
Chlamydia: NEGATIVE
Gonorrhea: NEGATIVE

## 2013-10-31 LAB — CBC
HEMATOCRIT: 35.4 % — AB (ref 36.0–46.0)
Hemoglobin: 11.9 g/dL — ABNORMAL LOW (ref 12.0–15.0)
MCH: 26.9 pg (ref 26.0–34.0)
MCHC: 33.6 g/dL (ref 30.0–36.0)
MCV: 79.9 fL (ref 78.0–100.0)
PLATELETS: 215 10*3/uL (ref 150–400)
RBC: 4.43 MIL/uL (ref 3.87–5.11)
RDW: 16.7 % — ABNORMAL HIGH (ref 11.5–15.5)
WBC: 9.4 10*3/uL (ref 4.0–10.5)

## 2013-10-31 LAB — TYPE AND SCREEN
ABO/RH(D): B POS
ANTIBODY SCREEN: NEGATIVE

## 2013-10-31 LAB — OB RESULTS CONSOLE RPR: RPR: NONREACTIVE

## 2013-10-31 LAB — POCT FERN TEST: POCT FERN TEST: POSITIVE

## 2013-10-31 LAB — OB RESULTS CONSOLE RUBELLA ANTIBODY, IGM: Rubella: IMMUNE

## 2013-10-31 LAB — OB RESULTS CONSOLE ANTIBODY SCREEN: Antibody Screen: NEGATIVE

## 2013-10-31 LAB — RPR

## 2013-10-31 LAB — OB RESULTS CONSOLE HIV ANTIBODY (ROUTINE TESTING): HIV: NONREACTIVE

## 2013-10-31 LAB — OB RESULTS CONSOLE HEPATITIS B SURFACE ANTIGEN: Hepatitis B Surface Ag: NEGATIVE

## 2013-10-31 MED ORDER — OXYCODONE-ACETAMINOPHEN 5-325 MG PO TABS
1.0000 | ORAL_TABLET | ORAL | Status: DC | PRN
Start: 2013-10-31 — End: 2013-11-01
  Administered 2013-11-01: 1 via ORAL
  Filled 2013-10-31: qty 1

## 2013-10-31 MED ORDER — OXYTOCIN 40 UNITS IN LACTATED RINGERS INFUSION - SIMPLE MED
62.5000 mL/h | INTRAVENOUS | Status: DC
Start: 2013-10-31 — End: 2013-11-03

## 2013-10-31 MED ORDER — PHENYLEPHRINE 40 MCG/ML (10ML) SYRINGE FOR IV PUSH (FOR BLOOD PRESSURE SUPPORT)
80.0000 ug | PREFILLED_SYRINGE | INTRAVENOUS | Status: DC | PRN
  Filled 2013-10-31: qty 10
  Filled 2013-10-31: qty 2

## 2013-10-31 MED ORDER — LACTATED RINGERS IV SOLN: 500.0000 mL | Freq: Once | INTRAVENOUS | Status: DC

## 2013-10-31 MED ORDER — FENTANYL 2.5 MCG/ML BUPIVACAINE 1/10 % EPIDURAL INFUSION (WH - ANES)
14.0000 mL/h | INTRAMUSCULAR | Status: DC | PRN
  Administered 2013-10-31 – 2013-11-01 (×2): 14 mL/h via EPIDURAL
  Filled 2013-10-31 (×2): qty 125

## 2013-10-31 MED ORDER — OXYTOCIN 40 UNITS IN LACTATED RINGERS INFUSION - SIMPLE MED
1.0000 m[IU]/min | INTRAVENOUS | Status: DC
Start: 2013-10-31 — End: 2013-11-03
  Administered 2013-10-31: 13 m[IU]/min via INTRAVENOUS
  Administered 2013-10-31: 2 m[IU]/min via INTRAVENOUS
  Administered 2013-11-01: 15 m[IU]/min via INTRAVENOUS
  Administered 2013-11-01: 666 m[IU]/min via INTRAVENOUS
  Filled 2013-10-31: qty 1000

## 2013-10-31 MED ORDER — DIPHENHYDRAMINE HCL 50 MG/ML IJ SOLN: 12.5000 mg | INTRAMUSCULAR | Status: DC | PRN

## 2013-10-31 MED ORDER — PHENYLEPHRINE 40 MCG/ML (10ML) SYRINGE FOR IV PUSH (FOR BLOOD PRESSURE SUPPORT)
80.0000 ug | PREFILLED_SYRINGE | INTRAVENOUS | Status: DC | PRN
  Filled 2013-10-31: qty 2

## 2013-10-31 MED ORDER — ACETAMINOPHEN 325 MG PO TABS
650.0000 mg | ORAL_TABLET | ORAL | Status: DC | PRN
  Administered 2013-11-01 (×2): 650 mg via ORAL
  Filled 2013-10-31 (×3): qty 2

## 2013-10-31 MED ORDER — LIDOCAINE HCL (PF) 1 % IJ SOLN
INTRAMUSCULAR | Status: DC | PRN
  Administered 2013-10-31 (×2): 5 mL

## 2013-10-31 MED ORDER — EPHEDRINE 5 MG/ML INJ
10.0000 mg | INTRAVENOUS | Status: DC | PRN
  Filled 2013-10-31: qty 2

## 2013-10-31 MED ORDER — LACTATED RINGERS IV SOLN: 500.0000 mL | INTRAVENOUS | Status: DC | PRN

## 2013-10-31 MED ORDER — CITRIC ACID-SODIUM CITRATE 334-500 MG/5ML PO SOLN
30.0000 mL | ORAL | Status: DC | PRN
  Administered 2013-11-01: 30 mL via ORAL
  Filled 2013-10-31: qty 15

## 2013-10-31 MED ORDER — ONDANSETRON HCL 4 MG/2ML IJ SOLN: 4.0000 mg | Freq: Four times a day (QID) | INTRAMUSCULAR | Status: DC | PRN

## 2013-10-31 MED ORDER — EPHEDRINE 5 MG/ML INJ
10.0000 mg | INTRAVENOUS | Status: DC | PRN
  Filled 2013-10-31: qty 2
  Filled 2013-10-31: qty 4

## 2013-10-31 MED ORDER — OXYTOCIN BOLUS FROM INFUSION: 500.0000 mL | INTRAVENOUS | Status: DC

## 2013-10-31 MED ORDER — IBUPROFEN 600 MG PO TABS
600.0000 mg | ORAL_TABLET | Freq: Four times a day (QID) | ORAL | Status: DC | PRN
  Administered 2013-11-01: 600 mg via ORAL
  Filled 2013-10-31: qty 1

## 2013-10-31 MED ORDER — TERBUTALINE SULFATE 1 MG/ML IJ SOLN: 0.2500 mg | Freq: Once | INTRAMUSCULAR | Status: AC | PRN

## 2013-10-31 MED ORDER — LIDOCAINE HCL (PF) 1 % IJ SOLN
30.0000 mL | INTRAMUSCULAR | Status: DC | PRN
  Filled 2013-10-31: qty 30

## 2013-10-31 MED ORDER — LACTATED RINGERS IV SOLN
INTRAVENOUS | Status: DC
  Administered 2013-10-31 – 2013-11-01 (×5): via INTRAVENOUS

## 2013-10-31 NOTE — H&P (Signed)
Pt is a 28 y/o asian female G1P0 at term who presented the ER c/o SROM. In the ER she had +pool+fern. Her cx was one cm. PNC was uncomplicated. PMHX: see hollister PE: VSSAF        HEENT-wnl        ABD-gravid, palp contractions. IMP/ IUP at term with SROM Plan/Admit

## 2013-10-31 NOTE — Anesthesia Preprocedure Evaluation (Signed)

## 2013-10-31 NOTE — MAU Note (Signed)
Patient states she had bloody show and leaking of fluid. States she is having tightening but no pain.

## 2013-10-31 NOTE — Anesthesia Procedure Notes (Signed)
Epidural Patient location during procedure: OB Start time: 10/31/2013 6:57 PM  Staffing Anesthesiologist: Brayton CavesJACKSON, Avishai Reihl Performed by: anesthesiologist   Preanesthetic Checklist Completed: patient identified, site marked, surgical consent, pre-op evaluation, timeout performed, IV checked, risks and benefits discussed and monitors and equipment checked  Epidural Patient position: sitting Prep: site prepped and draped and DuraPrep Patient monitoring: continuous pulse ox and blood pressure Approach: midline Location: L3-L4 Injection technique: LOR air  Needle:  Needle type: Tuohy  Needle gauge: 17 G Needle length: 9 cm and 9 Needle insertion depth: 5 cm cm Catheter type: closed end flexible Catheter size: 19 Gauge Catheter at skin depth: 10 cm Test dose: negative  Assessment Events: blood not aspirated, injection not painful, no injection resistance, negative IV test and no paresthesia  Additional Notes Patient identified.  Risk benefits discussed including failed block, incomplete pain control, headache, nerve damage, paralysis, blood pressure changes, nausea, vomiting, reactions to medication both toxic or allergic, and postpartum back pain.  Patient expressed understanding and wished to proceed.  All questions were answered.  Sterile technique used throughout procedure and epidural site dressed with sterile barrier dressing. No paresthesia or other complications noted.The patient did not experience any signs of intravascular injection such as tinnitus or metallic taste in mouth nor signs of intrathecal spread such as rapid motor block. Please see nursing notes for vital signs.

## 2013-11-01 ENCOUNTER — Encounter (HOSPITAL_COMMUNITY): Payer: Self-pay | Admitting: *Deleted

## 2013-11-01 DIAGNOSIS — Z8759 Personal history of other complications of pregnancy, childbirth and the puerperium: Secondary | ICD-10-CM

## 2013-11-01 MED ORDER — DIBUCAINE 1 % RE OINT: 1.0000 "application " | TOPICAL_OINTMENT | RECTAL | Status: DC | PRN

## 2013-11-01 MED ORDER — SODIUM CHLORIDE 0.9 % IV SOLN
3.0000 g | Freq: Once | INTRAVENOUS | Status: AC
  Administered 2013-11-01: 3 g via INTRAVENOUS
  Filled 2013-11-01: qty 3

## 2013-11-01 MED ORDER — TETANUS-DIPHTH-ACELL PERTUSSIS 5-2.5-18.5 LF-MCG/0.5 IM SUSP
0.5000 mL | Freq: Once | INTRAMUSCULAR | Status: AC
  Administered 2013-11-03: 0.5 mL via INTRAMUSCULAR
  Filled 2013-11-01: qty 0.5

## 2013-11-01 MED ORDER — SENNOSIDES-DOCUSATE SODIUM 8.6-50 MG PO TABS
2.0000 | ORAL_TABLET | ORAL | Status: DC
  Administered 2013-11-01 – 2013-11-02 (×2): 2 via ORAL
  Filled 2013-11-01 (×2): qty 2

## 2013-11-01 MED ORDER — ONDANSETRON HCL 4 MG/2ML IJ SOLN: 4.0000 mg | INTRAMUSCULAR | Status: DC | PRN

## 2013-11-01 MED ORDER — DIPHENHYDRAMINE HCL 25 MG PO CAPS: 25.0000 mg | ORAL_CAPSULE | Freq: Four times a day (QID) | ORAL | Status: DC | PRN

## 2013-11-01 MED ORDER — PRENATAL MULTIVITAMIN CH
1.0000 | ORAL_TABLET | Freq: Every day | ORAL | Status: DC
  Administered 2013-11-02 – 2013-11-03 (×2): 1 via ORAL
  Filled 2013-11-01 (×2): qty 1

## 2013-11-01 MED ORDER — BENZOCAINE-MENTHOL 20-0.5 % EX AERO
1.0000 "application " | INHALATION_SPRAY | CUTANEOUS | Status: DC | PRN
  Administered 2013-11-01 – 2013-11-03 (×2): 1 via TOPICAL
  Filled 2013-11-01 (×2): qty 56

## 2013-11-01 MED ORDER — ACETAMINOPHEN 325 MG PO TABS
650.0000 mg | ORAL_TABLET | Freq: Once | ORAL | Status: AC
  Administered 2013-11-01: 650 mg via ORAL

## 2013-11-01 MED ORDER — SODIUM CHLORIDE 0.9 % IV SOLN
2.0000 g | Freq: Once | INTRAVENOUS | Status: AC
  Administered 2013-11-01: 2 g via INTRAVENOUS
  Filled 2013-11-01: qty 2000

## 2013-11-01 MED ORDER — OXYCODONE-ACETAMINOPHEN 5-325 MG PO TABS
1.0000 | ORAL_TABLET | ORAL | Status: DC | PRN
  Filled 2013-11-01: qty 1

## 2013-11-01 MED ORDER — IBUPROFEN 600 MG PO TABS
600.0000 mg | ORAL_TABLET | Freq: Four times a day (QID) | ORAL | Status: DC
  Administered 2013-11-01 – 2013-11-03 (×8): 600 mg via ORAL
  Filled 2013-11-01 (×8): qty 1

## 2013-11-01 MED ORDER — ONDANSETRON HCL 4 MG PO TABS: 4.0000 mg | ORAL_TABLET | ORAL | Status: DC | PRN

## 2013-11-01 MED ORDER — WITCH HAZEL-GLYCERIN EX PADS: 1.0000 "application " | MEDICATED_PAD | CUTANEOUS | Status: DC | PRN

## 2013-11-01 MED ORDER — SIMETHICONE 80 MG PO CHEW: 80.0000 mg | CHEWABLE_TABLET | ORAL | Status: DC | PRN

## 2013-11-01 MED ORDER — LACTATED RINGERS IV SOLN
INTRAVENOUS | Status: DC
  Administered 2013-11-01: 04:00:00 via INTRAUTERINE

## 2013-11-01 MED ORDER — LANOLIN HYDROUS EX OINT: TOPICAL_OINTMENT | CUTANEOUS | Status: DC | PRN

## 2013-11-01 MED ORDER — ZOLPIDEM TARTRATE 5 MG PO TABS: 5.0000 mg | ORAL_TABLET | Freq: Every evening | ORAL | Status: DC | PRN

## 2013-11-01 NOTE — Progress Notes (Signed)
Pt has not had a cervical change is 4 hours. She also has a low grade temp and feels warm. PLAN/ Will place an IUPC and start Ampicillin.Will recheck in 1-2 hours if baby remains stable. Will adjust pit as needed.

## 2013-11-01 NOTE — Progress Notes (Signed)
0725 assumed care of pt

## 2013-11-01 NOTE — Progress Notes (Signed)
Delivery call made at 1013 and NICU team present in room at 1016 for vacumn assisted deliery with late decels and chorio.

## 2013-11-01 NOTE — Lactation Note (Signed)
This note was copied from the chart of Girl Aolanis Zelada. Lactation Consultation Note    Initial consuslt with this mom of a term baby, code apgar at birth, doing well on RA now in NICU, mom with maternal temp, and baby with cord around neck at birth, apgar 1,3,5. Mom wants to provide EBm for her baby, so I started her pumping with DEP, and showed her hand expression. Tiny drops of colostrum seen on right breast. Mom encouraged to pump and HE every 3 hours, 8 times a day. Mom demonstrated hand expression well. Mom very sleepy, and teacign will need to be reviewed again with mom. Skin to skin encouraged, as soon as mom is able to go see [name]  Patient Name: Girl Jerral Bonitohao Bradt Today's Date: 11/01/2013 Reason for consult: Initial assessment;NICU baby   Maternal Data Formula Feeding for Exclusion: Yes (bnaby in NICU) Infant to breast within first hour of birth: No Breastfeeding delayed due to:: Infant status Has patient been taught Hand Expression?: Yes Does the patient have breastfeeding experience prior to this delivery?: No  Feeding    LATCH Score/Interventions                      Lactation Tools Discussed/Used Tools: Pump Breast pump type: Double-Electric Breast Pump WIC Program: No (mom has Medicaid , so [facility] to see is she can get an appointmetn to apply) Pump Review: Setup, frequency, and cleaning;Milk Storage;Other (comment) (premie setting, hand expression, review of NICU booklet on how to provide EBM for a NICU baby) Initiated by:: clee rn, at 4 hours pp Date initiated:: 11/01/13   Consult Status Consult Status: Follow-up Date: 11/02/13 Follow-up type: In-patient    Alfred LevinsChristine Anne Charm Stenner 11/01/2013, 3:59 PM

## 2013-11-02 LAB — CBC
HCT: 25.8 % — ABNORMAL LOW (ref 36.0–46.0)
Hemoglobin: 8.4 g/dL — ABNORMAL LOW (ref 12.0–15.0)
MCH: 25.9 pg — AB (ref 26.0–34.0)
MCHC: 32.6 g/dL (ref 30.0–36.0)
MCV: 79.6 fL (ref 78.0–100.0)
Platelets: 168 10*3/uL (ref 150–400)
RBC: 3.24 MIL/uL — AB (ref 3.87–5.11)
RDW: 16.9 % — ABNORMAL HIGH (ref 11.5–15.5)
WBC: 18.1 10*3/uL — AB (ref 4.0–10.5)

## 2013-11-02 MED ORDER — HYDROCORTISONE ACE-PRAMOXINE 1-1 % RE FOAM
1.0000 | Freq: Two times a day (BID) | RECTAL | Status: DC
  Administered 2013-11-02: 1 via RECTAL
  Filled 2013-11-02: qty 10

## 2013-11-02 NOTE — Lactation Note (Signed)
This note was copied from the chart of Krystal Bruce. Lactation Consultation Note Mom states she is pumping every 3 hours, but not getting any milk out. Mom states she is also hand expressing, and getting few small drops. Baby is now 24 hours. Explained to mom that it is normal at this point to get very little with the breast pump. Enc mom to continue pumping every 3 hours and hand expressing. Mom understands the importance of continued pumping to establish milk supply.  Mom states she plans to purchase a pump.  Mom does not have any other concerns right now.   Patient Name: Krystal Jerral Bonitohao Abid Today's Date: 11/02/2013     Maternal Data    Feeding    LATCH Score/Interventions                      Lactation Tools Discussed/Used     Consult Status      Talmadge Coventrylizabeth F Sari Cogan 11/02/2013, 10:44 AM

## 2013-11-02 NOTE — Anesthesia Postprocedure Evaluation (Signed)
Anesthesia Post Note  Patient: Krystal Bruce  Procedure(s) Performed: * No procedures listed *  Anesthesia type: Epidural  Patient location: Mother/Baby  Post pain: Pain level controlled  Post assessment: Post-op Vital signs reviewed  Last Vitals:  Filed Vitals:   11/02/13 0532  BP: 85/56  Pulse: 111  Temp: 36.5 C  Resp: 16    Post vital signs: Reviewed  Level of consciousness: awake  Complications: No apparent anesthesia complications

## 2013-11-02 NOTE — Progress Notes (Signed)
Post Partum Day 1  Subjective: Patient is sore but otherwise without complaint. Patient is currently pumping. She does note some discomfort due to hemorrhoids. Baby is in the NICU doing well. She's on room air. She is receiving nutrition through an IV.  Objective: Blood pressure 85/56, pulse 111, temperature 97.7 F (36.5 C), temperature source Oral, resp. rate 16, height 4\' 10"  (1.473 m), weight 65.499 kg (144 lb 6.4 oz), last menstrual period 01/27/2013, SpO2 94.00%, unknown if currently breastfeeding.  Physical Exam:  General: alert, cooperative and appears stated age Lochia: appropriate Uterine Fundus: firm   Recent Labs  10/31/13 1000 11/02/13 0510  HGB 11.9* 8.4*  HCT 35.4* 25.8*    Assessment/Plan: Routine postpartum care   LOS: 2 days   Silvester Reierson H. Dalayla Aldredge 11/02/2013, 11:27 AM

## 2013-11-03 NOTE — Discharge Summary (Addendum)
Obstetric Discharge Summary Reason for Admission: rupture of membranes Prenatal Procedures: ultrasound Intrapartum Procedures: vacuum Postpartum Procedures: none Complications-Operative and Postpartum: midline episiotomy Hemoglobin  Date Value Ref Range Status  11/02/2013 8.4* 12.0 - 15.0 g/dL Final     DELTA CHECK NOTED     REPEATED TO VERIFY  01/19/2013 14.0  12.2 - 16.2 g/dL Final     HCT  Date Value Ref Range Status  11/02/2013 25.8* 36.0 - 46.0 % Final     HCT, POC  Date Value Ref Range Status  01/19/2013 42.5  37.7 - 47.9 % Final    Physical Exam:  General: alert and cooperative Lochia: appropriate Uterine Fundus: firm DVT Evaluation: No evidence of DVT seen on physical exam.  Discharge Diagnoses: Term Pregnancy-delivered  Discharge Information: Date: 11/03/2013 Activity: pelvic rest Diet: routine Medications: PNV, Ibuprofen and Colace Condition: stable Instructions: refer to practice specific booklet Discharge to: home Follow-up Information   Follow up with PINN, Sanjuana MaeWALDA STACIA, MD In 4 weeks.   Specialty:  Obstetrics and Gynecology   Contact information:   [address] [place] [address] [phone]       Newborn Data: Live born female  Birth Weight: 6 lb 4.9 oz (2860 g) APGAR: 1, 3  Home with mother.  Philip AspenSidney Kinshasa Throckmorton 11/03/2013, 10:24 AM

## 2013-11-03 NOTE — Lactation Note (Signed)
This note was copied from the chart of Krystal Jevaeh Siedlecki. Lactation Consultation Note      Follow up consult with this mom and baby, now 49 hours post partum, and 40 weeks corrected gestation. 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 I noted a tight frenulum, close to the tip of her tongue, and a heart shaped tongue with extension, and some limited side to side movement. Parents aware this may effect breast feeding, Dr. Algernon Huxleyattray made aware of above, and parents advised that if this becomes a difficulty with  Breast feeding, to speak to their pediatrician, . I also [facility] to [name], and baby's nurse, Krystal Bruce, to ask Dr Algernon Huxleyattray for a PT consult also. Krystal Bruce was fussy with trying to get her latched, so I left her skin to skin with mom, in cross cradle positioning, her mouth open at the breast. i showed mom and dad a nipple shiled, and told them we would try latching lana with this tool, at a later feeding. Mom is being discharged to home, no colostrum/milk expressed yet. She has a [facility] appointmntn for Monday, 5/18, and I will loan her a DEP   Patient Name: Krystal Bruce Today's Date: 11/03/2013 Reason for consult: Follow-up assessment;NICU baby   Maternal Data    Feeding Feeding Type: Formula Nipple Type: Regular Length of feed: 30 min  LATCH Score/Interventions Latch: Too sleepy or reluctant, no latch achieved, no sucking elicited. Intervention(s): Skin to skin;Teach feeding cues;Waking techniques  Audible Swallowing: None  Type of Nipple: Everted at rest and after stimulation  Comfort (Breast/Nipple): Soft / non-tender (no colostrum yeet with hand expression, no breast changes since birth, now at 49 hours pp)     Hold (Positioning): Assistance needed to correctly position infant at breast and maintain latch. Intervention(s): Breastfeeding basics reviewed;Support Pillows;Position options;Skin to  skin  LATCH Score: 5  Lactation Tools Discussed/Used Tools: Nipple Shields Nipple shield size: 16 WIC Program: No (mom applying on 5/18, and will loan DEP until then)   Consult Status Consult Status: Follow-up Follow-up type: In-patient (NICU)    Alfred LevinsChristine Anne Verley Pariseau 11/03/2013, 11:40 AM

## 2013-11-06 ENCOUNTER — Ambulatory Visit: Payer: Self-pay

## 2013-11-06 NOTE — Lactation Note (Signed)
This note was copied from the chart of Krystal Everli Septer. Lactation Consultation Note     Follow up consult with this mom of a NICU term baby, now 185 days old. Mom happy to tell me that she "has milk", but has not pumped since some time yesterday. On exam, she was very firm and dripping milk. I brought her to the pumping room, and with ice, massage and pumping, mom expressed at least 60 mls of milk. Pumping teaching reviewed with mom, breast care, ice, massage, frequency and duration. Mom admitts she does not remember anything I taught her , but [facility]. Krystal HugerLana is doing well, and I will work with mom latching her. Mom knows to call for questions/concerns  Patient Name: Krystal Bruce UJWJX'BToday's Date: 11/06/2013 Reason for consult: Follow-up assessment;NICU baby   Maternal Data    Feeding    LATCH Score/Interventions          Comfort (Breast/Nipple): Engorged, cracked, bleeding, large blisters, severe discomfort (mom had not pumped since yesterday, and admitts to not remembering anything I taught her in the first 2 days after delivery. Dad present for all of the teachign also.) Problem noted: Engorgment Intervention(s): Ice;Other (comment) (pmp at least 8 times a day, every 2-3 until knots of milk gone and breasts soften)           Lactation Tools Discussed/Used     Consult Status Consult Status: PRN Follow-up type: In-patient (NICU)    Alfred LevinsChristine Anne Zylah Elsbernd 11/06/2013, 4:00 PM

## 2013-11-11 ENCOUNTER — Ambulatory Visit: Payer: Self-pay | Admitting: Internal Medicine

## 2013-11-11 VITALS — BP 90/50 | HR 101 | Temp 98.2°F | Ht <= 58 in | Wt 124.0 lb

## 2013-11-11 DIAGNOSIS — R2230 Localized swelling, mass and lump, unspecified upper limb: Secondary | ICD-10-CM

## 2013-11-11 DIAGNOSIS — R229 Localized swelling, mass and lump, unspecified: Secondary | ICD-10-CM

## 2013-11-11 NOTE — Progress Notes (Signed)
   Subjective:    Patient ID: Krystal Bruce, female    DOB: December 30, 1985, 28 y.o.   MRN: [id number]  HPI This chart was scribed for Ellamae Sia, MD by Charline Bills, ED Scribe. The patient was seen in room 2. Patient's care was started at 12:49 PM.  HPI Comments: Krystal Bruce is a 28 y.o. female who presents to the [facility] complaining of lump under L arm pit that she noticed while pregnant. Pt gave birth 11 days ago. She expresses concerns of possible cancer. She denies associated pain.  Pt also presents with R hand numbness, excluding her thumb, that she noted towards the end of pregnancy. Pt reports sleeping a lot on her R side while she was pregnant. She denies associated pain.   Past Medical History  Diagnosis Date  . Medical history non-contributory    Current Outpatient Prescriptions on File Prior to Visit  Medication Sig Dispense Refill  . Prenatal Vit-Fe Fumarate-FA (PRENATAL MULTIVITAMIN) TABS tablet Take 1 tablet by mouth daily at 12 noon.       No current facility-administered medications on file prior to visit.   No Known Allergies  Review of Systems  Constitutional: Negative for fever and chills.  Neurological: Positive for numbness.      Objective:   Physical Exam  Nursing note and vitals reviewed. Constitutional: She is oriented to person, place, and time. She appears well-developed and well-nourished. No distress.  HENT:  Head: Normocephalic and atraumatic.  Eyes: EOM are normal.  Neck: Normal range of motion and full passive range of motion without pain. Neck supple.  Pulmonary/Chest: Effort normal. No respiratory distress.  Musculoskeletal: Normal range of motion.  In L axilla is a 3cm x 4cm soft, movable, nontender mast that is not contiguous with breast cancer  Shoulders and neck have full ROM  Neurological: She is alert and oriented to person, place, and time.  She has subjective decrease in sensation in 4 fingertips in R hand but has  preserved FTO and negative tinel's  Grip is full  Skin: Skin is warm and dry.  Psychiatric: She has a normal mood and affect. Her behavior is normal.      Assessment & Plan:   I personally performed the services described in this documentation, which was scribed in my presence. The recorded information has been reviewed and is accurate.  Soft tissue mass left axilla--? Lipoma versus sebaceous cyst  To Gen. surgery for removal  Paresthesias fingers right hand-  2 observe for now and following to resolution/recheck if not well in one to 2 months

## 2013-11-23 ENCOUNTER — Ambulatory Visit (INDEPENDENT_AMBULATORY_CARE_PROVIDER_SITE_OTHER): Payer: Medicaid Other | Admitting: General Surgery

## 2013-12-20 ENCOUNTER — Ambulatory Visit (INDEPENDENT_AMBULATORY_CARE_PROVIDER_SITE_OTHER): Payer: Medicaid Other | Admitting: General Surgery

## 2013-12-28 ENCOUNTER — Other Ambulatory Visit: Payer: Self-pay | Admitting: Obstetrics & Gynecology

## 2013-12-29 LAB — CYTOLOGY - PAP

## 2014-05-02 IMAGING — US US OB TRANSVAGINAL
1 series · 14 of 28 positions shown · non-contrast
Comparison: None.

CLINICAL DATA: Pelvic pain.  Positive pregnancy test.

OBSTETRIC <14 WK US AND TRANSVAGINAL OB US
TECHNIQUE: Both transabdominal and transvaginal ultrasound
examinations were performed for complete evaluation of the
gestation as well as the maternal uterus, adnexal regions, and
pelvic cul-de-sac.  Transvaginal technique was performed to assess
early pregnancy.

[Series 1: us ob transvaginal · 14 of 55 slices shown]
[im 3/55]
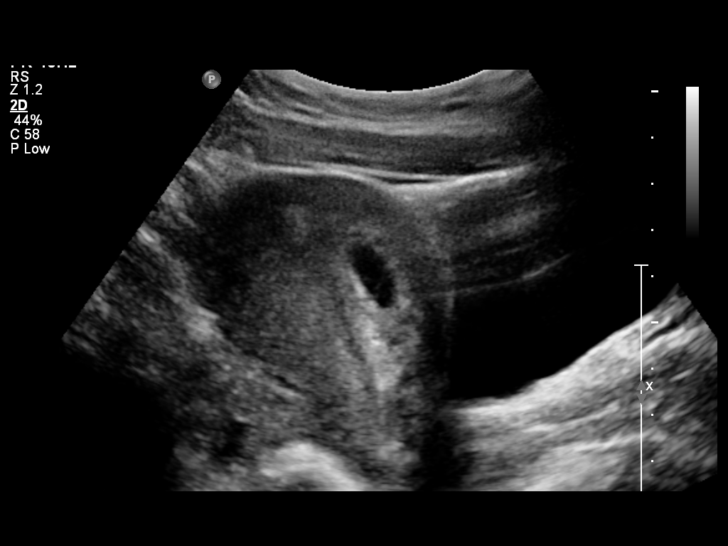
[im 7/55]
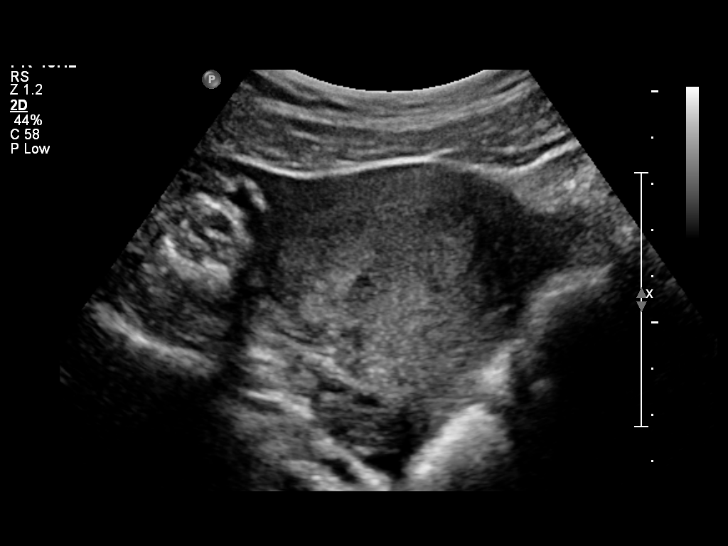
[im 11/55]
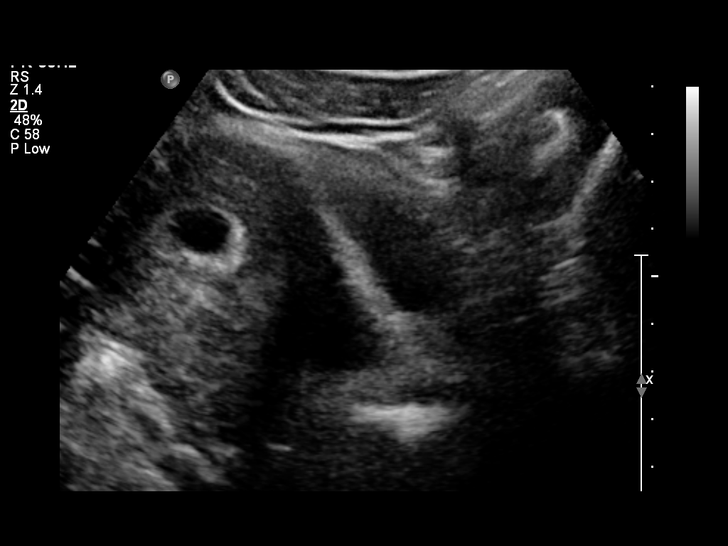
[im 15/55]
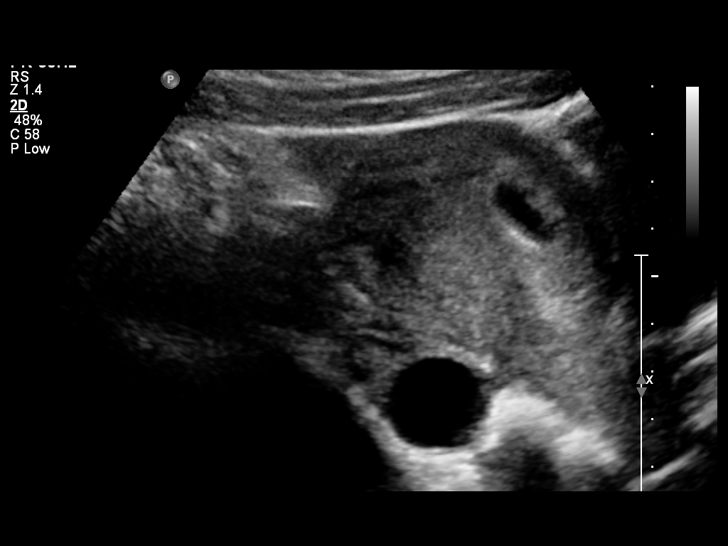
[im 19/55]
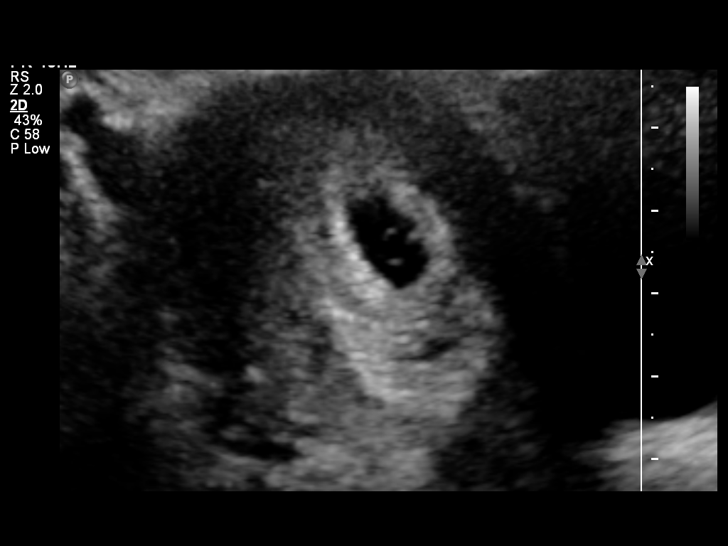
[im 23/55]
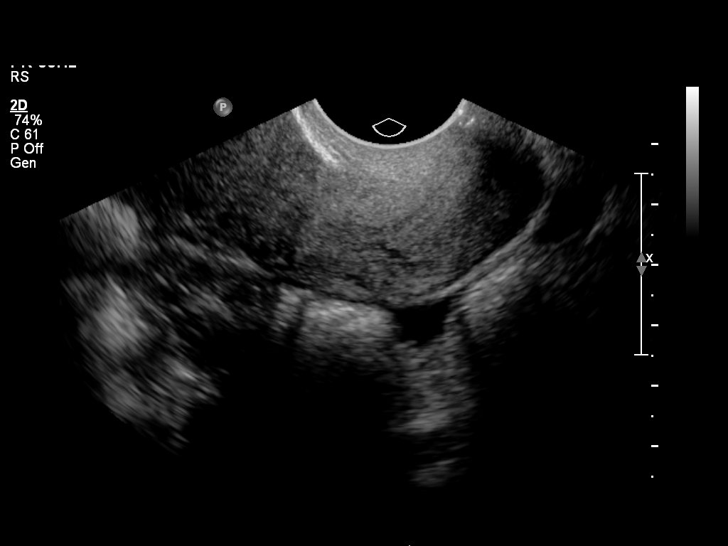
[im 27/55]
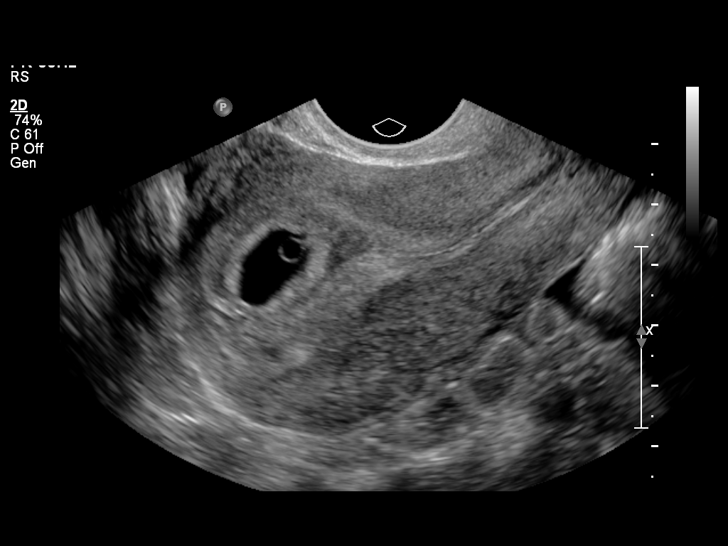
[im 31/55]
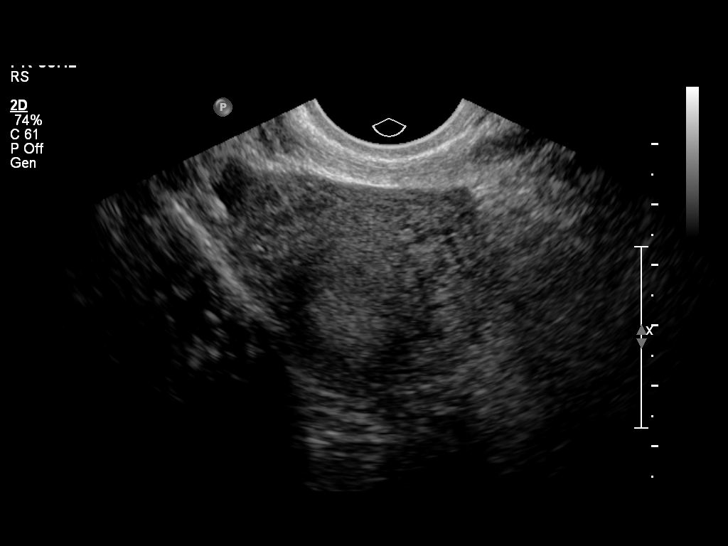
[im 35/55]
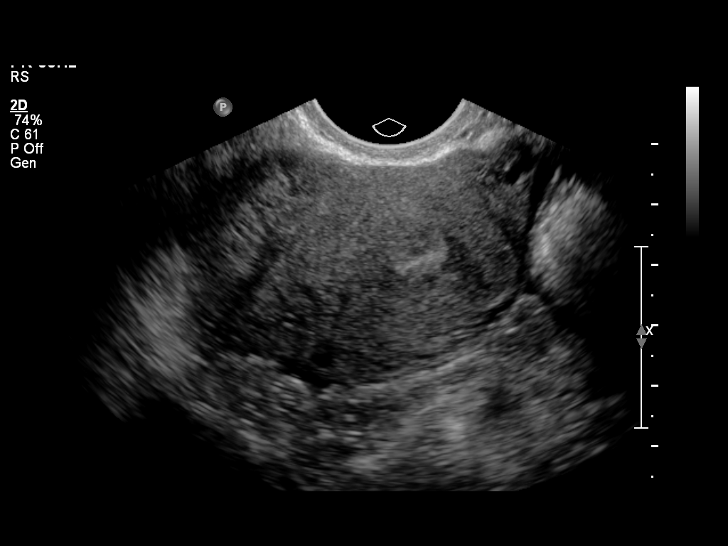
[im 39/55]
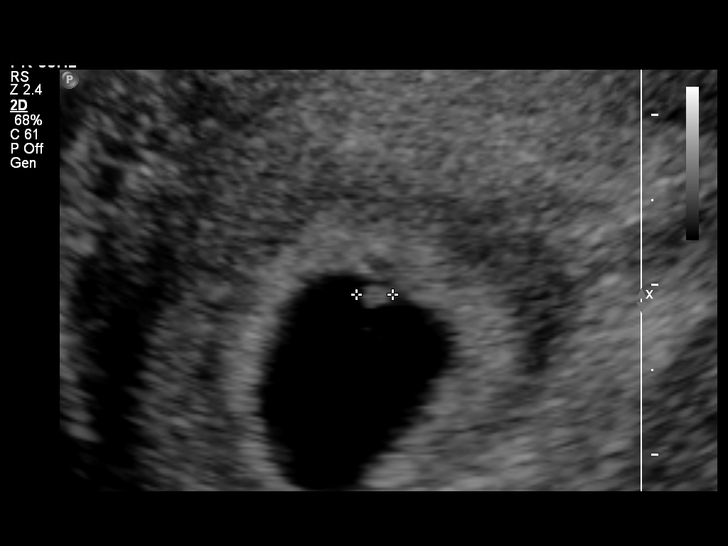
[im 43/55]
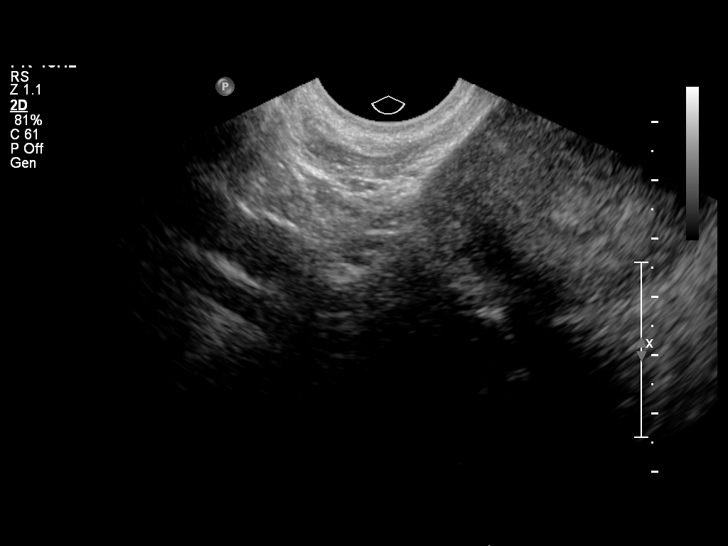
[im 47/55]
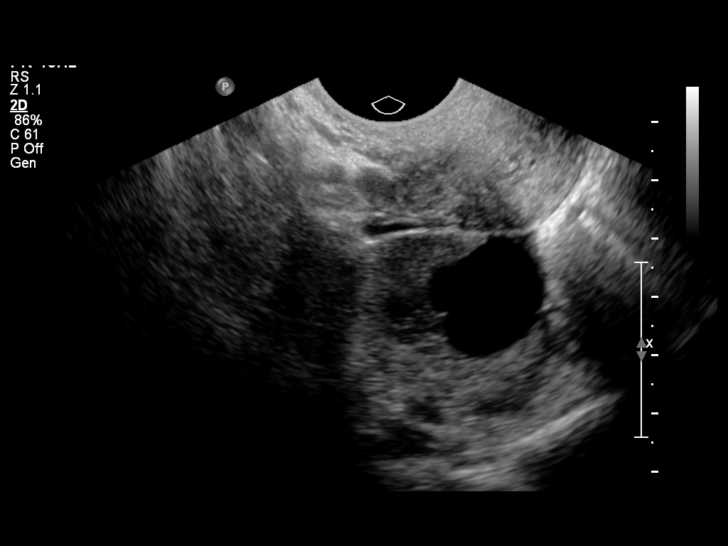
[im 51/55]
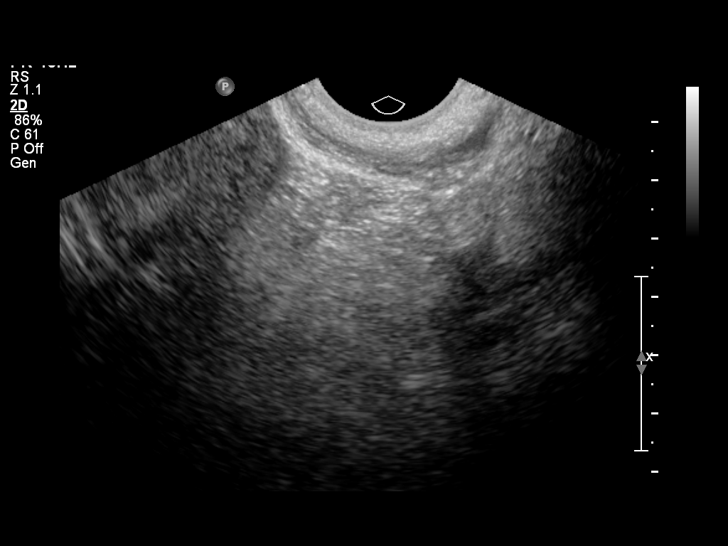
[im 55/55]
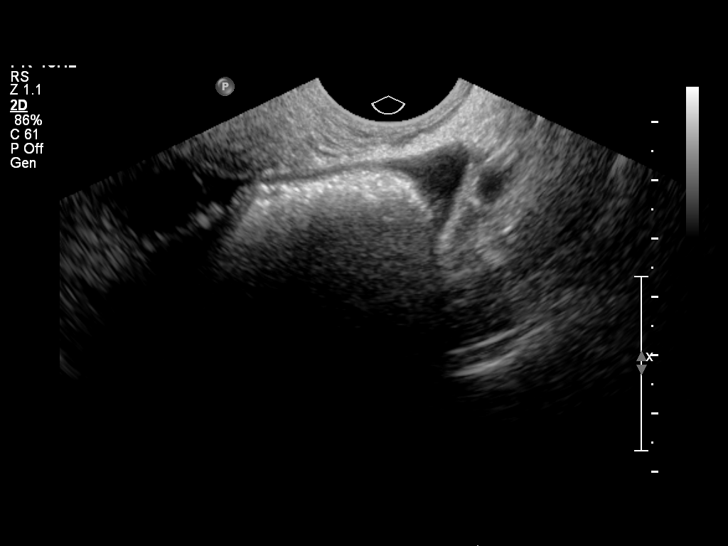

[14 of 28 positions shown; findings below may reference images not displayed]

Intrauterine gestational sac:  Visualized/normal in shape.
Yolk sac: Visualized
Embryo: Visualized
Cardiac Activity: Visualized
Heart Rate: Unable to accurately measured due to small embryo size
but visualized at real time exam.

CRL: 2  mm  5 w  6 d          US EDC: 11/04/13

Maternal uterus/adnexae:
Left ovary not visualized.  Right ovary normal.  Small free fluid
noted.
IMPRESSION: Intrauterine gestational sac, yolk sac, fetal pole, and cardiac
activity visualized.  No acute abnormality.

## 2014-12-04 HISTORY — PX: BREAST SURGERY: SHX581

## 2015-01-29 ENCOUNTER — Other Ambulatory Visit: Payer: Self-pay | Admitting: Physician Assistant

## 2015-01-29 ENCOUNTER — Ambulatory Visit (INDEPENDENT_AMBULATORY_CARE_PROVIDER_SITE_OTHER): Payer: Self-pay | Admitting: Physician Assistant

## 2015-01-29 VITALS — BP 100/56 | HR 98 | Temp 98.0°F | Resp 14 | Ht <= 58 in | Wt 101.0 lb

## 2015-01-29 DIAGNOSIS — Z139 Encounter for screening, unspecified: Secondary | ICD-10-CM

## 2015-01-29 LAB — POCT WET PREP WITH KOH
Clue Cells Wet Prep HPF POC: NEGATIVE
KOH PREP POC: NEGATIVE
Trichomonas, UA: NEGATIVE
Yeast Wet Prep HPF POC: NEGATIVE

## 2015-01-29 NOTE — Progress Notes (Signed)
01/29/2015 at 2:14 PM  Krystal Bruce / DOB: [birth year] / MRN: [id number]  The patient has Pregnancy; Labor and delivery, indication for care; and Status post vacuum-assisted vaginal delivery on her problem list.  SUBJECTIVE  Krystal Bruce is a 29 y.o. well appearing female presenting for the chief complaint of possible exposure to hepatitis B.  Reports she has been in a new relashionship and about 1 week ago began sexual intercourse.  She was told by her new partner today that he is positive for hepatitis B.  She has completed her hepatitis B series from 18 to 2000 and this is documented in Krystal Bruce.  She does not complain of vaginal discharge or rash.      Immunization History  Administered Date(s) Administered  . DTaP 11/22/1996, 01/22/1997, 05/28/1997, 04/05/1998  . Hepatitis A 12/17/2004, 06/18/2005  . Hepatitis B 05/28/1997, 02/20/1998, 02/07/1999  . IPV 11/22/1996, 01/22/1997, 04/05/1998, 02/05/1999  . MMR 11/22/1996, 05/28/1997  . Td 12/17/2004  . Tdap 11/03/2013  . Varicella 08/26/1997    She  has a past medical history of Medical history non-contributory.     Medications reviewed and updated by myself where necessary, and exist elsewhere in the encounter.   [name] has No Known Allergies. She  reports that she has never smoked. She has never used smokeless tobacco. She reports that she does not drink alcohol or use illicit drugs. She  reports that she currently engages in sexual activity. She reports using the following method of birth control/protection: None. The patient  has past surgical history that includes No past surgeries and Breast surgery (12/04/2014).  Her family history is not on file.  Review of Systems  Constitutional: Negative for fever and chills.  Respiratory: Negative for cough.   Gastrointestinal: Negative for vomiting, abdominal pain, diarrhea and constipation.  Genitourinary: Negative for dysuria.  Musculoskeletal: Negative for myalgias.  Skin: Negative for  rash.  Neurological: Negative for dizziness and headaches.    OBJECTIVE  Her  height is 4' 10"  (1.473 m) and weight is 101 lb (45.813 kg). Her oral temperature is 98 F (36.7 C). Her blood pressure is 100/56 and her pulse is 98. Her respiration is 14 and oxygen saturation is 99%.  The patient's body mass index is 21.11 kg/(m^2).  Physical Exam  Constitutional: She is oriented to person, place, and time. She appears well-developed and well-nourished. No distress.  Eyes: Conjunctivae and EOM are normal. Pupils are equal, round, and reactive to light.  Neck: No thyromegaly present.  Cardiovascular: Normal rate.   Respiratory: Effort normal.  GI: She exhibits no distension.  Genitourinary: There is no rash, tenderness, lesion or injury on the right labia. There is no rash, tenderness, lesion or injury on the left labia. Cervix exhibits no motion tenderness, no discharge and no friability. No erythema or tenderness in the vagina. No signs of injury around the vagina. No vaginal discharge found.  Musculoskeletal: Normal range of motion.  Neurological: She is alert and oriented to person, place, and time. She has normal reflexes.  Skin: Skin is warm and dry. She is not diaphoretic.    Results for orders placed or performed in visit on 01/29/15 (from the past 24 hour(s))  POCT Wet Prep with KOH     Status: None   Collection Time: 01/29/15  1:57 PM  Result Value Ref Range   Trichomonas, UA Negative    Clue Cells Wet Prep HPF POC neg    Epithelial Wet Prep HPF POC  Moderate Few, Moderate, Many   Yeast Wet Prep HPF POC neg    Bacteria Wet Prep HPF POC None None, Few   RBC Wet Prep HPF POC TNTC    WBC Wet Prep HPF POC 0-3    KOH Prep POC Negative     ASSESSMENT & PLAN  Krystal Bruce was seen today for wants std testing, possible exposure to hep b.  Diagnoses and all orders for this visit:  Screening: Patient here for screening of STI.  Fortunately her record shows a three shot series for Hep B.   Advised that, given partner reports a history of Hep B that we test for Hep C.   Orders: -     HIV antibody (with reflex) -     RPR -     GC/Chlamydia Probe Amp -     POCT Wet Prep with KOH -     Hepatitis C Ab Reflex HCV RNA, QUANT    The patient was advised to call or come back to clinic if she does not see an improvement in symptoms, or worsens with the above plan.   Philis Fendt, MHS, PA-C [facility] 01/29/2015 2:14 PM

## 2015-01-30 ENCOUNTER — Telehealth: Payer: Self-pay

## 2015-01-30 LAB — GC/CHLAMYDIA PROBE AMP
CT Probe RNA: NEGATIVE
GC Probe RNA: NEGATIVE

## 2015-01-30 LAB — HIV ANTIBODY (ROUTINE TESTING W REFLEX): HIV 1&2 Ab, 4th Generation: NONREACTIVE

## 2015-01-30 LAB — RPR

## 2015-01-30 LAB — HEPATITIS C ANTIBODY: HCV Ab: NEGATIVE

## 2015-02-19 ENCOUNTER — Other Ambulatory Visit: Payer: Self-pay | Admitting: Obstetrics & Gynecology

## 2015-02-20 LAB — CYTOLOGY - PAP

## 2015-05-03 ENCOUNTER — Ambulatory Visit (INDEPENDENT_AMBULATORY_CARE_PROVIDER_SITE_OTHER): Payer: Self-pay | Admitting: Family Medicine

## 2015-05-03 ENCOUNTER — Encounter: Payer: Self-pay | Admitting: Family Medicine

## 2015-05-03 VITALS — BP 108/72 | HR 72 | Temp 98.7°F | Resp 18 | Ht 59.0 in | Wt 104.2 lb

## 2015-05-03 DIAGNOSIS — Z113 Encounter for screening for infections with a predominantly sexual mode of transmission: Secondary | ICD-10-CM

## 2015-05-03 NOTE — Patient Instructions (Signed)
I will be in touch with your labs asap Remember the condoms are always a good idea to prevent infection and also pregnancy!

## 2015-05-03 NOTE — Progress Notes (Addendum)
[facility] [address], [place] [address] [phone]  Date:  05/03/2015   Name:  Krystal Bruce   DOB:  [birth year]   MRN:  [id number]  PCP:  No PCP Per Patient    Chief Complaint: No chief complaint on file.   History of Present Illness:  Krystal Bruce is a 29 y.o. very pleasant female patient who presents with the following:  Here today with concern about STI- would like testing. She recently had a new sex partner and would like to have gonorrhea and chlamydia testing to be sure all is ok.  She and her new partner have been together for about 3 weeks.  She is not having any symptoms or other concerns     Patient Active Problem List   Diagnosis Date Noted  . Status post vacuum-assisted vaginal delivery 11/01/2013  . Labor and delivery, indication for care 10/31/2013  . Pregnancy 03/06/2013    Past Medical History  Diagnosis Date  . Medical history non-contributory     Past Surgical History  Procedure Laterality Date  . No past surgeries    . Breast surgery  12/04/2014    Bilateral Implants    Social History  Substance Use Topics  . Smoking status: Never Smoker   . Smokeless tobacco: Never Used  . Alcohol Use: No    No family history on file.  No Known Allergies  Medication list has been reviewed and updated.  No current outpatient prescriptions on file prior to visit.   No current facility-administered medications on file prior to visit.    Review of Systems:  As per HPI- otherwise negative.  Physical Examination: Filed Vitals:   05/03/15 1759  BP: 108/72  Pulse: 72  Temp: 98.7 F (37.1 C)  Resp: 18   Filed Vitals:   05/03/15 1759  Height: 4\' 11"  (1.499 m)  Weight: 104 lb 3.2 oz (47.265 kg)   Body mass index is 21.03 kg/(m^2). Ideal Body Weight: Weight in (lb) to have BMI = 25: 123.5  GEN: WDWN, NAD, Non-toxic, A & O x 3 HEENT: Atraumatic, Normocephalic. Neck supple. No masses, No LAD. Ears and Nose: No external  deformity. CV: RRR, No M/G/R. No JVD. No thrill. No extra heart sounds. PULM: CTA B, no wheezes, crackles, rhonchi. No retractions. No resp. distress. No accessory muscle use. EXTR: No c/c/e NEURO Normal gait.  PSYCH: Normally interactive. Conversant. Not depressed or anxious appearing.  Calm demeanor.    Assessment and Plan: Screening for STD (sexually transmitted disease) - Plan: GC/Chlamydia Probe Amp  Advised that it is probably too early to retest her blood work but will Brooking a uriprobe for her [facility]  Signed Abbe AmsterdamJessica Kaci Freel, MD  Negative results recieved 11/14- [facility] Results for orders placed or performed in visit on 05/03/15  GC/Chlamydia Probe Amp  Result Value Ref Range   CT Probe RNA NEGATIVE    GC Probe RNA NEGATIVE

## 2015-05-06 LAB — GC/CHLAMYDIA PROBE AMP
CT PROBE, AMP APTIMA: NEGATIVE
GC PROBE AMP APTIMA: NEGATIVE

## 2015-07-26 ENCOUNTER — Ambulatory Visit (INDEPENDENT_AMBULATORY_CARE_PROVIDER_SITE_OTHER): Payer: BLUE CROSS/BLUE SHIELD | Admitting: Internal Medicine

## 2015-07-26 VITALS — BP 120/80 | HR 69 | Temp 98.4°F | Resp 20 | Ht <= 58 in | Wt 106.6 lb

## 2015-07-26 DIAGNOSIS — J0100 Acute maxillary sinusitis, unspecified: Secondary | ICD-10-CM | POA: Diagnosis not present

## 2015-07-26 MED ORDER — AMOXICILLIN 875 MG PO TABS: 875.0000 mg | ORAL_TABLET | Freq: Two times a day (BID) | ORAL | Status: DC

## 2015-07-26 NOTE — Patient Instructions (Signed)
12 hour sudafed to take each morning

## 2015-07-26 NOTE — Progress Notes (Signed)
   Subjective:  By signing my name below, I, Krystal Bruce, attest that this documentation has been prepared under the direction and in the presence of Tonye Pearson, MD Electronically Signed: Charline Bills, ED Scribe 07/26/2015 at 6:07 PM.   Patient ID: Krystal Bruce, female    DOB: 02-04-1986, 30 y.o.   MRN: [id number]  Chief Complaint  Patient presents with  . Cough    x 10 days  . Nasal Congestion  . Headache  . Shoulder Pain  . Fatigue  . Depression    see screnning   HPI HPI Comments: Krystal Bruce is a 30 y.o. female who presents to the [facility] complaining of persistent nasal congestion for the past 10 days. Pt reports associated gradually improving sore throat, HA, fatigue, shoulder/neck pain that she describes as "tightness". No treatments tried PTA. She denies fever and cough. Pt states that her daughter has been sick with similar symptoms. No known medical allergies.  Depression screening was positive. Pt is a single mom of an 61 m.o which she just got full custody of today. 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 She has no overt features to suggest that she has clinical depression.  Past Medical History  Diagnosis Date  . Medical history non-contributory    No current outpatient prescriptions on file prior to visit.   No current facility-administered medications on file prior to visit.   No Known Allergies  Review of Systems  Constitutional: Positive for fatigue. Negative for fever.  HENT: Positive for congestion and sore throat.   Respiratory: Negative for cough.   Musculoskeletal: Positive for myalgias.  Neurological: Positive for headaches.      Objective:   Physical Exam  Constitutional: She is oriented to person, place, and time. She appears well-developed and well-nourished. No distress.  HENT:  Head: Normocephalic and atraumatic.  Purulent discharge bilaterally. Ears and  throat clear. No nodes.   Eyes: Conjunctivae and EOM are normal.  Neck: Neck supple.  Cardiovascular: Normal rate.   Pulmonary/Chest: Effort normal. No respiratory distress.  Musculoskeletal: Normal range of motion.  Neurological: She is alert and oriented to person, place, and time.  Skin: Skin is warm and dry.  Psychiatric: She has a normal mood and affect. Her behavior is normal.  Nursing note and vitals reviewed.  further psychiatric evaluation revealed no problems with mood or affect. Thought content normal judgment sound.    Assessment & Plan:  Acute maxillary sinusitis, recurrence not specified  Meds ordered this encounter  Medications  . amoxicillin (AMOXIL) 875 MG tablet    Sig: Take 1 tablet (875 mg total) by mouth 2 (two) times daily.    Dispense:  20 tablet    Refill:  0   sudafed I have completed the patient encounter in its entirety as documented by the scribe, with editing by me where necessary. Koi Yarbro P. Merla Riches, M.D.

## 2015-08-11 ENCOUNTER — Ambulatory Visit (INDEPENDENT_AMBULATORY_CARE_PROVIDER_SITE_OTHER): Payer: BLUE CROSS/BLUE SHIELD | Admitting: Family Medicine

## 2015-08-11 VITALS — BP 122/72 | HR 92 | Temp 98.5°F | Resp 17 | Ht 58.5 in | Wt 108.0 lb

## 2015-08-11 DIAGNOSIS — J101 Influenza due to other identified influenza virus with other respiratory manifestations: Secondary | ICD-10-CM | POA: Diagnosis not present

## 2015-08-11 DIAGNOSIS — R6889 Other general symptoms and signs: Secondary | ICD-10-CM | POA: Diagnosis not present

## 2015-08-11 LAB — POCT CBC
Granulocyte percent: 57.8 %G (ref 37–80)
HEMATOCRIT: 37.7 % (ref 37.7–47.9)
Hemoglobin: 12.6 g/dL (ref 12.2–16.2)
LYMPH, POC: 1.3 (ref 0.6–3.4)
MCH, POC: 27.4 pg (ref 27–31.2)
MCHC: 33.4 g/dL (ref 31.8–35.4)
MCV: 81.9 fL (ref 80–97)
MID (cbc): 0.2 (ref 0–0.9)
MPV: 7.6 fL (ref 0–99.8)
POC GRANULOCYTE: 2 (ref 2–6.9)
POC LYMPH %: 37 % (ref 10–50)
POC MID %: 5.2 %M (ref 0–12)
Platelet Count, POC: 193 10*3/uL (ref 142–424)
RBC: 4.6 M/uL (ref 4.04–5.48)
RDW, POC: 14.9 %
WBC: 3.5 10*3/uL — AB (ref 4.6–10.2)

## 2015-08-11 LAB — POCT INFLUENZA A/B
INFLUENZA A, POC: POSITIVE — AB
INFLUENZA B, POC: NEGATIVE

## 2015-08-11 MED ORDER — OSELTAMIVIR PHOSPHATE 75 MG PO CAPS: 75.0000 mg | ORAL_CAPSULE | Freq: Two times a day (BID) | ORAL | Status: DC

## 2015-08-11 NOTE — Patient Instructions (Signed)
1. RECOMMEND AFRIN NASAL SPRAY -- 2 SPRAYS INTO EACH NOSTRIL TWICE DAILY.  Influenza, Adult Influenza ("the flu") is a viral infection of the respiratory tract. It occurs more often in winter months because people spend more time in close contact with one another. Influenza can make you feel very sick. Influenza easily spreads from person to person (contagious). CAUSES  Influenza is caused by a virus that infects the respiratory tract. You can catch the virus by breathing in droplets from an infected person's cough or sneeze. You can also catch the virus by touching something that was recently contaminated with the virus and then touching your mouth, nose, or eyes. RISKS AND COMPLICATIONS You may be at risk for a more severe case of influenza if you smoke cigarettes, have diabetes, have chronic heart disease (such as heart failure) or lung disease (such as asthma), or if you have a weakened immune system. Elderly people and pregnant women are also at risk for more serious infections. The most common problem of influenza is a lung infection (pneumonia). Sometimes, this problem can require emergency medical care and may be life threatening. SIGNS AND SYMPTOMS  Symptoms typically last 4 to 10 days and may include:  Fever.  Chills.  Headache, body aches, and muscle aches.  Sore throat.  Chest discomfort and cough.  Poor appetite.  Weakness or feeling tired.  Dizziness.  Nausea or vomiting. DIAGNOSIS  Diagnosis of influenza is often made based on your history and a physical exam. A nose or throat swab test can be done to confirm the diagnosis. TREATMENT  In mild cases, influenza goes away on its own. Treatment is directed at relieving symptoms. For more severe cases, your health care provider may prescribe antiviral medicines to shorten the sickness. Antibiotic medicines are not effective because the infection is caused by a virus, not by bacteria. HOME CARE INSTRUCTIONS  Take medicines  only as directed by your health care provider.  Use a cool mist humidifier to make breathing easier.  Get plenty of rest until your temperature returns to normal. This usually takes 3 to 4 days.  Drink enough fluid to keep your urine clear or pale yellow.  Cover yourmouth and nosewhen coughing or sneezing,and wash your handswellto prevent thevirusfrom spreading.  Stay homefromwork orschool untilthe fever is gonefor at least 66full day. PREVENTION  An annual influenza vaccination (flu shot) is the best way to avoid getting influenza. An annual flu shot is now routinely recommended for all adults in the [place] SEEK MEDICAL CARE IF:  You experiencechest pain, yourcough worsens,or you producemore mucus.  Youhave nausea,vomiting, ordiarrhea.  Your fever returns or gets worse. SEEK IMMEDIATE MEDICAL CARE IF:  You havetrouble breathing, you become short of breath,or your skin ornails becomebluish.  You have severe painor stiffnessin the neck.  You develop a sudden headache, or pain in the face or ear.  You have nausea or vomiting that you cannot control. MAKE SURE YOU:   Understand these instructions.  Will watch your condition.  Will get help right away if you are not doing well or get worse.   This information is not intended to replace advice given to you by your health care provider. Make sure you discuss any questions you have with your health care provider.   Document Released: 06/05/2000 Document Revised: 06/29/2014 Document Reviewed: 09/07/2011 Elsevier Interactive Patient Education [employer]

## 2015-08-11 NOTE — Progress Notes (Signed)
Subjective:    Patient ID: Krystal Bruce, female    DOB: 1986-02-10, 30 y.o.   MRN: [id number]  08/11/2015  Sore Throat; Insomnia; Fatigue; Generalized Body Aches; and Nasal Congestion   HPI This 30 y.o. female presents for evaluation of sore throat, fatigue, body aches, nasal congestion.  Onset two days ago.  Started with body aches, fatigue, sore throat mild and dry, nasal congestion.  No fever but +chills.  +HA a lot.  No ear pain.  +ST mild.  +nasal congestion. +rhinorrhea.    Taking Mucinex Cold Severe.  No tobacco abuse.  No flu vaccine.  Has 38 month old daughter.  No recent travel.  Treated 07/26/15 for acute sinusitis; prescribed abx; took one dose and felt better so stopped medication.  Was well for a few days; had menses on 08-06-15.  Owns nail salon and spa.   Review of Systems  Constitutional: Positive for chills and fatigue. Negative for fever.  HENT: Positive for congestion, postnasal drip, rhinorrhea and sore throat. Negative for ear pain and trouble swallowing.   Respiratory: Positive for cough. Negative for shortness of breath and wheezing.   Gastrointestinal: Negative for nausea, vomiting, abdominal pain and diarrhea.  Neurological: Positive for headaches. Negative for dizziness.    Past Medical History  Diagnosis Date  . Medical history non-contributory    Past Surgical History  Procedure Laterality Date  . No past surgeries    . Breast surgery  12/04/2014    Bilateral Implants   No Known Allergies  Social History   Social History  . Marital Status: Single    Spouse Name: N/A  . Number of Children: N/A  . Years of Education: N/A   Occupational History  . Not on file.   Social History Main Topics  . Smoking status: Never Smoker   . Smokeless tobacco: Never Used  . Alcohol Use: No  . Drug Use: No  . Sexual Activity: Yes    Birth Control/ Protection: None   Other Topics Concern  . Not on file   Social History Narrative   History reviewed. No  pertinent family history.     Objective:    BP 122/72 mmHg  Pulse 92  Temp(Src) 98.5 F (36.9 C) (Oral)  Resp 17  Ht 4' 10.5" (1.486 m)  Wt 108 lb (48.988 kg)  BMI 22.18 kg/m2  SpO2 98%  LMP 08/06/2015  Breastfeeding? No Physical Exam  Constitutional: She is oriented to person, place, and time. She appears well-developed and well-nourished. No distress.  HENT:  Head: Normocephalic and atraumatic.  Right Ear: Tympanic membrane, external ear and ear canal normal.  Left Ear: Tympanic membrane, external ear and ear canal normal.  Nose: Mucosal edema and rhinorrhea present. Right sinus exhibits no maxillary sinus tenderness and no frontal sinus tenderness. Left sinus exhibits no maxillary sinus tenderness and no frontal sinus tenderness.  Mouth/Throat: Uvula is midline, oropharynx is clear and moist and mucous membranes are normal.  Eyes: Conjunctivae are normal. Pupils are equal, round, and reactive to light.  Neck: Normal range of motion. Neck supple.  Cardiovascular: Normal rate, regular rhythm and normal heart sounds.  Exam reveals no gallop and no friction rub.   No murmur heard. Pulmonary/Chest: Effort normal and breath sounds normal. She has no wheezes. She has no rales.  Neurological: She is alert and oriented to person, place, and time.  Skin: She is not diaphoretic.  Psychiatric: She has a normal mood and affect. Her behavior is normal.  Nursing note and vitals reviewed.  Results for orders placed or performed in visit on 08/11/15  POCT CBC  Result Value Ref Range   WBC 3.5 (A) 4.6 - 10.2 K/uL   Lymph, poc 1.3 0.6 - 3.4   POC LYMPH PERCENT 37.0 10 - 50 %L   MID (cbc) 0.2 0 - 0.9   POC MID % 5.2 0 - 12 %M   POC Granulocyte 2.0 2 - 6.9   Granulocyte percent 57.8 37 - 80 %G   RBC 4.60 4.04 - 5.48 M/uL   Hemoglobin 12.6 12.2 - 16.2 g/dL   HCT, POC 04.5 40.9 - 47.9 %   MCV 81.9 80 - 97 fL   MCH, POC 27.4 27 - 31.2 pg   MCHC 33.4 31.8 - 35.4 g/dL   RDW, POC 81.1 %    Platelet Count, POC 193 142 - 424 K/uL   MPV 7.6 0 - 99.8 fL  POCT Influenza A/B  Result Value Ref Range   Influenza A, POC Positive (A) Negative   Influenza B, POC Negative Negative       Assessment & Plan:   1. Influenza A   2. Flu-like symptoms    -New. -Rx for Tamiflu provided. -Continue Mucinex product as prescribed. -Recommend Afrin nasal spray OTC.   Orders Placed This Encounter  Procedures  . POCT CBC  . POCT Influenza A/B   Meds ordered this encounter  Medications  . guaiFENesin (MUCINEX) 600 MG 12 hr tablet    Sig: Take by mouth 2 (two) times daily.  Marland Kitchen oseltamivir (TAMIFLU) 75 MG capsule    Sig: Take 1 capsule (75 mg total) by mouth 2 (two) times daily.    Dispense:  10 capsule    Refill:  0    No Follow-up on file.    Alora Gorey Paulita Fujita, M.D. [facility] [address] [place], [place]  [address] [phone] phone [phone] fax

## 2015-09-27 ENCOUNTER — Ambulatory Visit (INDEPENDENT_AMBULATORY_CARE_PROVIDER_SITE_OTHER): Payer: BLUE CROSS/BLUE SHIELD | Admitting: Physician Assistant

## 2015-09-27 VITALS — BP 116/68 | HR 85 | Temp 98.3°F | Resp 17 | Ht 58.5 in | Wt 108.0 lb

## 2015-09-27 DIAGNOSIS — R590 Localized enlarged lymph nodes: Secondary | ICD-10-CM

## 2015-09-27 LAB — POCT CBC
Granulocyte percent: 63.6 % (ref 37–80)
HCT, POC: 40 % (ref 37.7–47.9)
Hemoglobin: 13.7 g/dL (ref 12.2–16.2)
Lymph, poc: 2.2 (ref 0.6–3.4)
MCH, POC: 28.2 pg (ref 27–31.2)
MCHC: 34.4 g/dL (ref 31.8–35.4)
MCV: 81.9 fL (ref 80–97)
MID (cbc): 0.6 (ref 0–0.9)
MPV: 7.4 fL (ref 0–99.8)
POC Granulocyte: 4.9 (ref 2–6.9)
POC LYMPH PERCENT: 28.9 % (ref 10–50)
POC MID %: 7.5 % (ref 0–12)
Platelet Count, POC: 224 K/uL (ref 142–424)
RBC: 4.88 M/uL (ref 4.04–5.48)
RDW, POC: 14 %
WBC: 7.7 K/uL (ref 4.6–10.2)

## 2015-09-27 LAB — POCT RAPID STREP A (OFFICE): Rapid Strep A Screen: NEGATIVE

## 2015-09-27 NOTE — Progress Notes (Signed)
   09/27/2015 5:38 PM   DOB: 08-10-1985 / MRN: [id number]  SUBJECTIVE:  Krystal Bruce is a 30 y.o. female presenting for right sided lymphadenopathy that started yesterday.  She denies sore throat, fever, chills, nausea, cat scratches.  She has tried nothing for the pain.  She has never had this problem before.    Immunization History  Administered Date(s) Administered  . DTaP 11/22/1996, 01/22/1997, 05/28/1997, 04/05/1998  . Hepatitis A 12/17/2004, 06/18/2005  . Hepatitis B 05/28/1997, 02/20/1998, 02/07/1999  . IPV 11/22/1996, 01/22/1997, 04/05/1998, 02/05/1999  . MMR 11/22/1996, 05/28/1997  . Td 12/17/2004  . Tdap 11/03/2013  . Varicella 08/26/1997     She has No Known Allergies.   She  has a past medical history of Medical history non-contributory.    She  reports that she has never smoked. She has never used smokeless tobacco. She reports that she does not drink alcohol or use illicit drugs. She  reports that she currently engages in sexual activity. She reports using the following method of birth control/protection: None. The patient  has past surgical history that includes No past surgeries and Breast surgery (12/04/2014).  Her family history is not on file.  ROS  Per HPI  Problem list and medications reviewed and updated by myself where necessary, and exist elsewhere in the encounter.   OBJECTIVE:  BP 116/68 mmHg  Pulse 85  Temp(Src) 98.3 F (36.8 C) (Oral)  Resp 17  Ht 4' 10.5" (1.486 m)  Wt 108 lb (48.988 kg)  BMI 22.18 kg/m2  SpO2 97%  LMP 09/04/2015  Breastfeeding? No  Physical Exam  Constitutional: She is oriented to person, place, and time. She appears well-nourished. No distress.  HENT:  Head:    Eyes: EOM are normal. Pupils are equal, round, and reactive to light.  Cardiovascular: Normal rate and regular rhythm.   Pulmonary/Chest: Effort normal and breath sounds normal.  Abdominal: She exhibits no distension.  Lymphadenopathy:       Head (right side):  Tonsillar adenopathy present.       Head (left side): No tonsillar adenopathy present.    She has no cervical adenopathy.    She has no axillary adenopathy.  Neurological: She is alert and oriented to person, place, and time. No cranial nerve deficit. Gait normal.  Skin: Skin is warm and dry. She is not diaphoretic.  Psychiatric: She has a normal mood and affect.  Vitals reviewed.   No results found for this or any previous visit (from the past 72 hour(s)).  No results found.  ASSESSMENT AND PLAN  Krystal Bruce was seen today for neck swelling.  Diagnoses and all orders for this visit:  Lymphadenopathy, submandibular: Her symptoms are well out of proportion to her exam.  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  This may be somatic in nature.  Will plan to scan in 20 days if the problem does not resolve on its own.   -     POCT rapid strep A -     POCT CBC -     Culture, Group A Strep    The patient was advised to call or return to clinic if she does not see an improvement in symptoms or to seek the care of the closest emergency department if she worsens with the above plan.   Philis Fendt, MHS, PA-C [facility] 09/27/2015 5:38 PM

## 2015-09-27 NOTE — Patient Instructions (Signed)
     IF you received an x-ray today, you will receive an invoice from [facility]. Please contact [facility] at [phone] with questions or concerns regarding your invoice.   IF you received labwork today, you will receive an invoice from [facility]. Please contact [facility] at [phone] with questions or concerns regarding your invoice.   Our billing staff will not be able to assist you with questions regarding bills from these companies.  You will be contacted with the lab results as soon as they are available. The fastest way to get your results is to activate your My Chart account. Instructions are located on the last page of this paperwork. If you have not heard from us regarding the results in 2 weeks, please contact this office.      

## 2015-09-29 LAB — CULTURE, GROUP A STREP: ORGANISM ID, BACTERIA: NORMAL

## 2016-01-03 ENCOUNTER — Ambulatory Visit (HOSPITAL_COMMUNITY)
Admission: EM | Admit: 2016-01-03 | Discharge: 2016-01-03 | Disposition: A | Discharge status: Home / Self Care | Payer: Medicaid Other | Attending: Internal Medicine | Admitting: Internal Medicine

## 2016-01-03 ENCOUNTER — Encounter (HOSPITAL_COMMUNITY): Payer: Self-pay | Admitting: Emergency Medicine

## 2016-01-03 DIAGNOSIS — Z7251 High risk heterosexual behavior: Secondary | ICD-10-CM

## 2016-01-03 DIAGNOSIS — Z202 Contact with and (suspected) exposure to infections with a predominantly sexual mode of transmission: Secondary | ICD-10-CM | POA: Insufficient documentation

## 2016-01-03 DIAGNOSIS — Z113 Encounter for screening for infections with a predominantly sexual mode of transmission: Secondary | ICD-10-CM

## 2016-01-03 NOTE — ED Provider Notes (Signed)
CSN: [id number]     Arrival date & time 01/03/16  1727 History   First MD Initiated Contact with Patient 01/03/16 1843     Chief Complaint  Patient presents with  . SEXUALLY TRANSMITTED DISEASE   (Consider location/radiation/quality/duration/timing/severity/associated sxs/prior Treatment) HPI Comments: Patient is a 30 yo female who presents for a "std" check. No symptoms. High risk behavior.  The history is provided by the patient.    Past Medical History  Diagnosis Date  . Medical history non-contributory    Past Surgical History  Procedure Laterality Date  . No past surgeries    . Breast surgery  12/04/2014    Bilateral Implants   No family history on file. Social History  Substance Use Topics  . Smoking status: Never Smoker   . Smokeless tobacco: Never Used  . Alcohol Use: No   OB History    Gravida Para Term Preterm AB TAB SAB Ectopic Multiple Living   1 1 1       1      Review of Systems  All other systems reviewed and are negative.   Allergies  Review of patient's allergies indicates no known allergies.  Home Medications   Prior to Admission medications   Medication Sig Start Date End Date Taking? Authorizing Provider  guaiFENesin (MUCINEX) 600 MG 12 hr tablet Take by mouth 2 (two) times daily.    Historical Provider, MD  oseltamivir (TAMIFLU) 75 MG capsule Take 1 capsule (75 mg total) by mouth 2 (two) times daily. Patient not taking: Reported on 09/27/2015 08/11/15   Ethelda ChickKristi M Smith, MD   Meds Ordered and Administered this Visit  Medications - No data to display  BP 115/79 mmHg  Pulse 62  Temp(Src) 98.2 F (36.8 C) (Oral)  Resp 16  SpO2 98%  LMP 01/02/2016 No data found.   Physical Exam  Constitutional: She is oriented to person, place, and time. She appears well-developed and well-nourished.  Genitourinary: Vagina normal and uterus normal.  Neurological: She is alert and oriented to person, place, and time.  Skin: Skin is warm and dry.   Psychiatric: Her behavior is normal.  Nursing note and vitals reviewed.   ED Course  Procedures (including critical care time)  Labs Review Labs Reviewed  CERVICOVAGINAL ANCILLARY ONLY    Imaging Review No results found.   Visual Acuity Review  Right Eye Distance:   Left Eye Distance:   Bilateral Distance:    Right Eye Near:   Left Eye Near:    Bilateral Near:         MDM   1. Screen for STD (sexually transmitted disease)   2. High risk sexual behavior    Pelvic screen. Call with results. Practice safe sex.    Riki SheerMichelle G Jerene Yeager, PA-C 01/03/16 1928

## 2016-01-03 NOTE — ED Notes (Signed)
PT reports she is seeing someone new and would like an STD check. PT has no symptoms. PT reports she is currently on her menstrual and has an IUD in.

## 2016-01-03 NOTE — Discharge Instructions (Signed)
Safe Sex We will call you with results.     Safe sex is about reducing the risk of giving or getting a sexually transmitted disease (STD). STDs are spread through sexual contact involving the genitals, mouth, or rectum. Some STDs can be cured and others cannot. Safe sex can also prevent unintended pregnancies.  WHAT ARE SOME SAFE SEX PRACTICES?  Limit your sexual activity to only one partner who is having sex with only you.  Talk to your partner about his or her past partners, past STDs, and drug use.  Use a condom every time you have sexual intercourse. This includes vaginal, oral, and anal sexual activity. Both females and males should wear condoms during oral sex. Only use latex or polyurethane condoms and water-based lubricants. Using petroleum-based lubricants or oils to lubricate a condom will weaken the condom and increase the chance that it will break. The condom should be in place from the beginning to the end of sexual activity. Wearing a condom reduces, but does not completely eliminate, your risk of getting or giving an STD. STDs can be spread by contact with infected body fluids and skin.  Get vaccinated for hepatitis B and HPV.  Avoid alcohol and recreational drugs, which can affect your judgment. You may forget to use a condom or participate in high-risk sex.  For females, avoid douching after sexual intercourse. Douching can spread an infection farther into the reproductive tract.  Check your body for signs of sores, blisters, rashes, or unusual discharge. See your health care provider if you notice any of these signs.  Avoid sexual contact if you have symptoms of an infection or are being treated for an STD. If you or your partner has herpes, avoid sexual contact when blisters are present. Use condoms at all other times.  If you are at risk of being infected with HIV, it is recommended that you take a prescription medicine daily to prevent HIV infection. This is called  pre-exposure prophylaxis (PrEP). You are considered at risk if:  You are a man who has sex with other men (MSM).  You are a heterosexual man or woman who is sexually active with more than one partner.  You take drugs by injection.  You are sexually active with a partner who has HIV.  Talk with your health care provider about whether you are at high risk of being infected with HIV. If you choose to begin PrEP, you should first be tested for HIV. You should then be tested every 3 months for as long as you are taking PrEP.  See your health care provider for regular screenings, exams, and tests for other STDs. Before having sex with a new partner, each of you should be screened for STDs and should talk about the results with each other. WHAT ARE THE BENEFITS OF SAFE SEX?   There is less chance of getting or giving an STD.  You can prevent unwanted or unintended pregnancies.  By discussing safe sex concerns with your partner, you may increase feelings of intimacy, comfort, trust, and honesty between the two of you.   This information is not intended to replace advice given to you by your health care provider. Make sure you discuss any questions you have with your health care provider.   Document Released: 07/16/2004 Document Revised: 06/29/2014 Document Reviewed: 11/30/2011 Elsevier Interactive Patient Education [employer] Inc[employer]

## 2016-01-06 LAB — CERVICOVAGINAL ANCILLARY ONLY
Chlamydia: NEGATIVE
Neisseria Gonorrhea: NEGATIVE

## 2016-01-07 LAB — CERVICOVAGINAL ANCILLARY ONLY: Wet Prep (BD Affirm): NEGATIVE

## 2016-01-07 NOTE — ED Notes (Signed)
[facility], lab results given. No further treatment indicated. PT verbalized understanding. PT notified 01/07/16 at 4:20 pm.

## 2016-03-21 ENCOUNTER — Ambulatory Visit (INDEPENDENT_AMBULATORY_CARE_PROVIDER_SITE_OTHER): Payer: BLUE CROSS/BLUE SHIELD | Admitting: Physician Assistant

## 2016-03-21 VITALS — BP 98/50 | HR 68 | Temp 98.5°F | Resp 16 | Ht 58.5 in | Wt 100.0 lb

## 2016-03-21 DIAGNOSIS — L989 Disorder of the skin and subcutaneous tissue, unspecified: Secondary | ICD-10-CM

## 2016-03-21 NOTE — Progress Notes (Addendum)
[facility] [address], [place] [address] [phone]  By signing my name below I, [name], attest that this documentation has been prepared under the direction and in the presence of Trena Platt PA. Electonically Signed. [name], Scribe 03/21/2016 at 3:43 PM  Date:  03/21/2016   Name:  Krystal Bruce   DOB:  [birth year]   MRN:  [id number]  PCP:  No PCP Per Patient   Chief Complaint  Patient presents with   bruise on breast    Patient states she has a red spot on her right breast     History of Present Illness:  Krystal Bruce is a 30 y.o. female patient who presents to [facility] discolored area on left breast. Pt had breast augmentation with memory gel and [place] She noticed a small red area on right breast about 1 week ago. She reports some left breast pain while sleeping. She denies drainage. Pt also reports prolonged lactation. She reports having an older aunt who still produces milk as well.   Patient Active Problem List   Diagnosis Date Noted   Status post vacuum-assisted vaginal delivery 11/01/2013   Labor and delivery, indication for care 10/31/2013   Pregnancy 03/06/2013    Past Medical History:  Diagnosis Date   Medical history non-contributory     Past Surgical History:  Procedure Laterality Date   BREAST SURGERY  12/04/2014   Bilateral Implants   NO PAST SURGERIES      Social History  Substance Use Topics   Smoking status: Never Smoker   Smokeless tobacco: Never Used   Alcohol use No    No family history on file.  No Known Allergies  Medication list has been reviewed and updated.  No current outpatient prescriptions on file prior to visit.   No current facility-administered medications on file prior to visit.     ROS ROS unremarkable unless otherwise specified.  Physical Examination: BP (!) 98/50    Pulse 68    Temp 98.5 F (36.9 C) (Oral)    Resp 16    Ht 4'  10.5" (1.486 m)    Wt 100 lb (45.4 kg)    SpO2 98%    BMI 20.54 kg/m  Ideal Body Weight: @FLOWAMB ([id number])@  Physical Exam  Constitutional: She is oriented to person, place, and time. She appears well-developed and well-nourished. No distress.  HENT:  Head: Normocephalic and atraumatic.  Right Ear: External ear normal.  Left Ear: External ear normal.  Eyes: Conjunctivae and EOM are normal. Pupils are equal, round, and reactive to light.  Cardiovascular: Normal rate.   Pulmonary/Chest: Effort normal. No respiratory distress.  Genitourinary:  Genitourinary Comments: right breast with erythematous nodule at the 3 o clock region at the areolar border. No discharge. No tenderness. No drainage. No peau d'orange    Lymphadenopathy:    She has no axillary adenopathy.  Neurological: She is alert and oriented to person, place, and time.  Skin: She is not diaphoretic.  Psychiatric: She has a normal mood and affect. Her behavior is normal.   Assessment and Plan: Krystal Bruce is a 30 y.o. female who is here today for cc of bump on right breast. This appears to possibly be more superficial.  I am advising that we go ahead and proceed with ultrasound.  Bumps on skin - Plan: US BREAST COMPLETE UNI RIGHT INC AXILLA  Trena Platt, PA-C [facility] 10/1/20172:15 PM

## 2016-03-21 NOTE — Patient Instructions (Addendum)
Please await contact for breast ultrasound.      IF you received an x-ray today, you will [facility]. Please contact [facility] at [phone] with questions or concerns regarding your invoice.   IF you received labwork today, you will receive an invoice from [facility]. Please contact [facility] at [phone] with questions or concerns regarding your invoice.   Our billing staff will not be able to assist you with questions regarding bills from these companies.  You will be contacted with the lab results as soon as they are available. The fastest way to get your results is to activate your My Chart account. Instructions are located on the last page of this paperwork. If you have not heard from us regarding the results in 2 weeks, please contact this office.

## 2016-06-29 ENCOUNTER — Other Ambulatory Visit: Payer: Self-pay | Admitting: Obstetrics & Gynecology

## 2016-07-02 LAB — CYTOLOGY - PAP

## 2017-03-11 ENCOUNTER — Encounter: Payer: Self-pay | Admitting: Physician Assistant

## 2017-03-11 DIAGNOSIS — N939 Abnormal uterine and vaginal bleeding, unspecified: Secondary | ICD-10-CM | POA: Insufficient documentation

## 2017-09-21 ENCOUNTER — Encounter: Payer: Self-pay | Admitting: Physician Assistant

## 2018-04-26 LAB — HM PAP SMEAR

## 2018-05-25 ENCOUNTER — Encounter: Payer: Self-pay | Admitting: Family Medicine

## 2019-08-11 ENCOUNTER — Ambulatory Visit: Payer: Self-pay | Admitting: Registered Nurse

## 2019-08-11 ENCOUNTER — Telehealth: Payer: Self-pay | Admitting: Registered Nurse

## 2019-10-03 ENCOUNTER — Ambulatory Visit: Payer: 59 | Admitting: Family Medicine

## 2019-10-03 ENCOUNTER — Ambulatory Visit: Payer: 59 | Admitting: Registered Nurse

## 2019-10-04 ENCOUNTER — Encounter: Payer: Self-pay | Admitting: Registered Nurse

## 2020-12-02 ENCOUNTER — Ambulatory Visit
Admission: EM | Admit: 2020-12-02 | Discharge: 2020-12-02 | Disposition: A | Discharge status: Home / Self Care | Payer: Self-pay | Attending: Emergency Medicine | Admitting: Emergency Medicine

## 2020-12-02 ENCOUNTER — Encounter: Payer: Self-pay | Admitting: Emergency Medicine

## 2020-12-02 ENCOUNTER — Other Ambulatory Visit: Payer: Self-pay

## 2020-12-02 DIAGNOSIS — S61309A Unspecified open wound of unspecified finger with damage to nail, initial encounter: Secondary | ICD-10-CM

## 2020-12-02 MED ORDER — IBUPROFEN 800 MG PO TABS
800.0000 mg | ORAL_TABLET | Freq: Once | ORAL | Status: AC
  Administered 2020-12-02: 800 mg via ORAL

## 2020-12-02 MED ORDER — CEPHALEXIN 500 MG PO CAPS: 500.0000 mg | ORAL_CAPSULE | Freq: Four times a day (QID) | ORAL | 0 refills | Status: AC

## 2020-12-02 MED ORDER — IBUPROFEN 600 MG PO TABS: 600.0000 mg | ORAL_TABLET | Freq: Four times a day (QID) | ORAL | 0 refills | Status: AC | PRN

## 2020-12-02 NOTE — ED Triage Notes (Signed)
Patient c/o RT thumb injury that occurred yesterday.   Patient states " I was opening my tesla and my nail got caught in it and the whole thing came off".   Patient endorses pain upon bending RT thumb.   Patient is able to bend thumb upon examination.   Patient hasn't used any medications for injury.

## 2020-12-02 NOTE — Discharge Instructions (Addendum)
Take the antibiotic as directed.  Take the ibuprofen as needed for discomfort.    Keep your wound clean and dry.  Wash it gently twice a day with soap and water.  Apply an antibiotic cream twice a day.    Return here if you have signs of infection, such as increased redness, pus-like drainage, fever, chills, or other concerning symptoms.

## 2020-12-02 NOTE — ED Provider Notes (Signed)
Renaldo Fiddler    CSN: [id number] Arrival date & time: 12/02/20  0853      History   Chief Complaint Chief Complaint  Patient presents with   Finger Injury    HPI Krystal Bruce is a 35 y.o. female.  Patient presents with avulsion of her right thumbnail that occurred yesterday.  She states she was opening her Tesla car door nail got caught; it came completely off.  She denies any joint or bone injury.  She denies fever, chills, purulent drainage, numbness, weakness, or other symptoms.  Treatment at home with bandage.  She denies pertinent medical history.  The history is provided by the patient.   Past Medical History:  Diagnosis Date   Medical history non-contributory     Patient Active Problem List   Diagnosis Date Noted   Abnormal uterine bleeding 03/11/2017   Status post vacuum-assisted vaginal delivery 11/01/2013   Labor and delivery, indication for care 10/31/2013   Pregnancy 03/06/2013    Past Surgical History:  Procedure Laterality Date   BREAST SURGERY  12/04/2014   Bilateral Implants   NO PAST SURGERIES      OB History     Gravida  1   Para  1   Term  1   Preterm      AB      Living  1      SAB      IAB      Ectopic      Multiple      Live Births  1            Home Medications    Prior to Admission medications   Medication Sig Start Date End Date Taking? Authorizing Provider  cephALEXin (KEFLEX) 500 MG capsule Take 1 capsule (500 mg total) by mouth 4 (four) times daily. 12/02/20  Yes Mickie Bail, NP  ibuprofen (ADVIL) 600 MG tablet Take 1 tablet (600 mg total) by mouth every 6 (six) hours as needed. 12/02/20  Yes Mickie Bail, NP    Family History History reviewed. No pertinent family history.  Social History Social History   Tobacco Use   Smoking status: Never   Smokeless tobacco: Never  Substance Use Topics   Alcohol use: No   Drug use: No     Allergies   Patient has no known allergies.   Review of  Systems Review of Systems  Constitutional:  Negative for chills and fever.  Respiratory:  Negative for cough and shortness of breath.   Cardiovascular:  Negative for chest pain and palpitations.  Musculoskeletal:  Negative for arthralgias and joint swelling.  Skin:  Positive for wound. Negative for color change.       Complete avulsion of right thumb nail.  Neurological:  Negative for seizures and syncope.  All other systems reviewed and are negative.   Physical Exam Triage Vital Signs ED Triage Vitals  Enc Vitals Group     BP      Pulse      Resp      Temp      Temp src      SpO2      Weight      Height      Head Circumference      Peak Flow      Pain Score      Pain Loc      Pain Edu?      Excl. in GC?    No  data found.  Updated Vital Signs BP 98/61 (BP Location: Left Arm)   Pulse 83   Temp 98.2 F (36.8 C) (Oral)   Resp 18   LMP 11/27/2020 (Approximate)   SpO2 98%   Visual Acuity Right Eye Distance:   Left Eye Distance:   Bilateral Distance:    Right Eye Near:   Left Eye Near:    Bilateral Near:     Physical Exam Vitals and nursing note reviewed.  Constitutional:      General: She is not in acute distress.    Appearance: She is well-developed.  HENT:     Head: Normocephalic and atraumatic.     Mouth/Throat:     Mouth: Mucous membranes are moist.  Cardiovascular:     Rate and Rhythm: Normal rate and regular rhythm.     Heart sounds: Normal heart sounds.  Pulmonary:     Effort: Pulmonary effort is normal. No respiratory distress.     Breath sounds: Normal breath sounds.  Abdominal:     Palpations: Abdomen is soft.     Tenderness: There is no abdominal tenderness.  Musculoskeletal:        General: No swelling, tenderness, deformity or signs of injury. Normal range of motion.     Cervical back: Neck supple.  Skin:    General: Skin is warm and dry.     Comments: Right thumbnail complete avulsion.  Scant bloody drainage and dried blood.  No  purulent drainage.  Sensation intact.  Full range of motion and strength 5/5 in right thumb.  Neurological:     General: No focal deficit present.     Mental Status: She is alert and oriented to person, place, and time.     Sensory: No sensory deficit.     Motor: No weakness.     Gait: Gait normal.  Psychiatric:        Mood and Affect: Mood normal.        Behavior: Behavior normal.     UC Treatments / Results  Labs (all labs ordered are listed, but only abnormal results are displayed) Labs Reviewed - No data to display  EKG   Radiology No results found.  Procedures Procedures (including critical care time)  Medications Ordered in UC Medications  ibuprofen (ADVIL) tablet 800 mg (800 mg Oral Given 12/02/20 7209)    Initial Impression / Assessment and Plan / UC Course  I have reviewed the triage vital signs and the nursing notes.  Pertinent labs & imaging results that were available during my care of the patient were reviewed by me and considered in my medical decision making (see chart for details).  Complete avulsion of right thumbnail.  Wound cleaned and dressed.  Treating with Keflex and ibuprofen.  Wound care instructions and signs of infection discussed at length.  Instructed patient to return here or follow-up with her PCP if she notes signs of infection.  She agrees to plan of care.    Final Clinical Impressions(s) / UC Diagnoses   Final diagnoses:  Avulsion of fingernail, initial encounter     Discharge Instructions      Take the antibiotic as directed.  Take the ibuprofen as needed for discomfort.    Keep your wound clean and dry.  Wash it gently twice a day with soap and water.  Apply an antibiotic cream twice a day.    Return here if you have signs of infection, such as increased redness, pus-like drainage, fever, chills, or other concerning  symptoms.          ED Prescriptions     Medication Sig Dispense Auth. Provider   cephALEXin (KEFLEX)  500 MG capsule Take 1 capsule (500 mg total) by mouth 4 (four) times daily. 28 capsule Wendee Beavers H, NP   ibuprofen (ADVIL) 600 MG tablet Take 1 tablet (600 mg total) by mouth every 6 (six) hours as needed. 30 tablet Mickie Bail, NP      PDMP not reviewed this encounter.   Mickie Bail, NP 12/02/20 [phone]

## 2021-05-07 ENCOUNTER — Ambulatory Visit
Admission: EM | Admit: 2021-05-07 | Discharge: 2021-05-07 | Disposition: A | Discharge status: Home / Self Care | Payer: Self-pay | Attending: Emergency Medicine | Admitting: Emergency Medicine

## 2021-05-07 ENCOUNTER — Encounter: Payer: Self-pay | Admitting: Emergency Medicine

## 2021-05-07 ENCOUNTER — Other Ambulatory Visit: Payer: Self-pay

## 2021-05-07 DIAGNOSIS — S29011A Strain of muscle and tendon of front wall of thorax, initial encounter: Secondary | ICD-10-CM

## 2021-05-07 MED ORDER — PREDNISONE 10 MG PO TABS: ORAL_TABLET | ORAL | 0 refills | Status: AC

## 2021-05-07 NOTE — ED Triage Notes (Signed)
Pt here with cough after URI for over a week. Pain under left breast when breathing deeply and coughing.

## 2021-05-07 NOTE — Discharge Instructions (Addendum)
Take prednisone as prescribed.  Warm compresses to the area several times daily will help with discomfort.  Practice deep breathing throughout the day.  If you begin to have any shortness of breath, worsening chest pain, dizziness, fainting or severe worsening of symptoms please present to the emergency department immediately.

## 2021-05-07 NOTE — ED Provider Notes (Signed)
CHIEF COMPLAINT:   Chief Complaint  Patient presents with   Cough     SUBJECTIVE/HPI:   Cough A very pleasant 35 y.o.Female presents today with cough after a URI which started about a week ago.  Patient reports discomfort under left breast with deep breathing and coughing. Patient does not report any shortness of breath, palpitations, visual changes, weakness, tingling, headache, nausea, vomiting, diarrhea, fever, chills.   has a past medical history of Medical history non-contributory.  ROS:  Review of Systems  Respiratory:  Positive for cough.   See Subjective/HPI Medications, Allergies and Problem List personally reviewed in Epic today OBJECTIVE:   Vitals:   05/07/21 1708  BP: 100/68  Pulse: 88  Resp: 18  Temp: 98.1 F (36.7 C)  SpO2: 99%    Physical Exam   General: Appears well-developed and well-nourished. No acute distress.  HEENT Head: Normocephalic and atraumatic.   Ears: Hearing grossly intact, no drainage or visible deformity.  Nose: No nasal deviation.   Mouth/Throat: No stridor or tracheal deviation.  Non erythematous posterior pharynx noted with clear drainage present.  No white patchy exudate noted. Eyes: Conjunctivae and EOM are normal. No eye drainage or scleral icterus bilaterally.  Neck: Normal range of motion, neck is supple.  Cardiovascular: Normal rate. Regular rhythm; no murmurs, gallops, or rubs.  Pulm/Chest: No respiratory distress. Breath sounds normal bilaterally without wheezes, rhonchi, or rales.  Discomfort noted to palpation of left intercostals. Neurological: Alert and oriented to person, place, and time.  Skin: Skin is warm and dry.  No rashes, lesions, abrasions or bruising noted to skin.   Psychiatric: Normal mood, affect, behavior, and thought content.   Vital signs and nursing note reviewed.   Patient stable and cooperative with examination. PROCEDURES:    LABS/X-RAYS/EKG/MEDS:   No results found for any visits on  05/07/21.  MEDICAL DECISION MAKING:   Patient presents with cough after a URI which started about a week ago.  Patient reports discomfort under left breast with deep breathing and coughing. Patient does not report any shortness of breath, palpitations, visual changes, weakness, tingling, headache, nausea, vomiting, diarrhea, fever, chills.  Given symptoms along with assessment findings, likely intercostal strain.  Rx prednisone to the patient's preferred pharmacy and advised the use of warm compresses and deep breathing techniques.  Advised to go to the ED for any shortness of breath, worsening chest pain, dizziness, fainting, or severe worsening of symptoms.  Patient verbalized understanding and agreed with treatment plan.  Patient stable upon discharge. ASSESSMENT/PLAN:  1. Intercostal muscle strain, initial encounter - predniSONE (DELTASONE) 10 MG tablet; Take 6 tablets (60 mg total) by mouth daily for 1 day, THEN 5 tablets (50 mg total) daily for 1 day, THEN 4 tablets (40 mg total) daily for 1 day, THEN 3 tablets (30 mg total) daily for 1 day, THEN 2 tablets (20 mg total) daily for 1 day, THEN 1 tablet (10 mg total) daily for 1 day.  Dispense: 21 tablet; Refill: 0 Instructions about new medications and side effects provided.  Plan:   Discharge Instructions      Take prednisone as prescribed.  Warm compresses to the area several times daily will help with discomfort.  Practice deep breathing throughout the day.  If you begin to have any shortness of breath, worsening chest pain, dizziness, fainting or severe worsening of symptoms please present to the emergency department immediately.         Amalia Greenhouse, FNP 05/07/21 1734

## 2021-10-27 NOTE — Telephone Encounter (Signed)
NA
